# Patient Record
Sex: Female | Born: 1945 | Race: White | Hispanic: No | Smoking: Current every day smoker
Health system: Southern US, Community
[De-identification: ages and names within clinical notes are randomized; demographics above are authoritative.]

## PROBLEM LIST (undated history)

## (undated) DIAGNOSIS — I10 Essential (primary) hypertension: Secondary | ICD-10-CM

## (undated) DIAGNOSIS — E119 Type 2 diabetes mellitus without complications: Secondary | ICD-10-CM

## (undated) DIAGNOSIS — F419 Anxiety disorder, unspecified: Secondary | ICD-10-CM

## (undated) DIAGNOSIS — K219 Gastro-esophageal reflux disease without esophagitis: Secondary | ICD-10-CM

## (undated) DIAGNOSIS — J449 Chronic obstructive pulmonary disease, unspecified: Secondary | ICD-10-CM

## (undated) DIAGNOSIS — N289 Disorder of kidney and ureter, unspecified: Secondary | ICD-10-CM

## (undated) DIAGNOSIS — E7800 Pure hypercholesterolemia, unspecified: Secondary | ICD-10-CM

## (undated) DIAGNOSIS — M1990 Unspecified osteoarthritis, unspecified site: Secondary | ICD-10-CM

## (undated) HISTORY — PX: TOTAL HIP ARTHROPLASTY: SHX124

## (undated) HISTORY — PX: CHOLECYSTECTOMY: SHX55

---

## 2017-02-28 ENCOUNTER — Other Ambulatory Visit: Payer: Self-pay

## 2017-02-28 ENCOUNTER — Inpatient Hospital Stay (HOSPITAL_BASED_OUTPATIENT_CLINIC_OR_DEPARTMENT_OTHER)
Admission: EM | Admit: 2017-02-28 | Discharge: 2017-03-05 | DRG: 481 | Disposition: A | Discharge status: Skilled Nursing Facility | Payer: Medicare Other | Attending: Family Medicine | Admitting: Family Medicine

## 2017-02-28 ENCOUNTER — Inpatient Hospital Stay (HOSPITAL_BASED_OUTPATIENT_CLINIC_OR_DEPARTMENT_OTHER): Payer: Medicare Other

## 2017-02-28 ENCOUNTER — Emergency Department (HOSPITAL_BASED_OUTPATIENT_CLINIC_OR_DEPARTMENT_OTHER): Payer: Medicare Other

## 2017-02-28 ENCOUNTER — Encounter (HOSPITAL_BASED_OUTPATIENT_CLINIC_OR_DEPARTMENT_OTHER): Payer: Self-pay | Admitting: Emergency Medicine

## 2017-02-28 DIAGNOSIS — S72402A Unspecified fracture of lower end of left femur, initial encounter for closed fracture: Principal | ICD-10-CM | POA: Diagnosis present

## 2017-02-28 DIAGNOSIS — Y9234 Swimming pool (public) as the place of occurrence of the external cause: Secondary | ICD-10-CM

## 2017-02-28 DIAGNOSIS — R339 Retention of urine, unspecified: Secondary | ICD-10-CM | POA: Diagnosis present

## 2017-02-28 DIAGNOSIS — M1990 Unspecified osteoarthritis, unspecified site: Secondary | ICD-10-CM | POA: Diagnosis present

## 2017-02-28 DIAGNOSIS — Z419 Encounter for procedure for purposes other than remedying health state, unspecified: Secondary | ICD-10-CM

## 2017-02-28 DIAGNOSIS — I129 Hypertensive chronic kidney disease with stage 1 through stage 4 chronic kidney disease, or unspecified chronic kidney disease: Secondary | ICD-10-CM | POA: Diagnosis present

## 2017-02-28 DIAGNOSIS — J441 Chronic obstructive pulmonary disease with (acute) exacerbation: Secondary | ICD-10-CM | POA: Diagnosis present

## 2017-02-28 DIAGNOSIS — M25562 Pain in left knee: Secondary | ICD-10-CM | POA: Diagnosis present

## 2017-02-28 DIAGNOSIS — N183 Chronic kidney disease, stage 3 (moderate): Secondary | ICD-10-CM | POA: Diagnosis present

## 2017-02-28 DIAGNOSIS — S7292XD Unspecified fracture of left femur, subsequent encounter for closed fracture with routine healing: Secondary | ICD-10-CM | POA: Diagnosis not present

## 2017-02-28 DIAGNOSIS — N179 Acute kidney failure, unspecified: Secondary | ICD-10-CM | POA: Diagnosis present

## 2017-02-28 DIAGNOSIS — E1122 Type 2 diabetes mellitus with diabetic chronic kidney disease: Secondary | ICD-10-CM | POA: Diagnosis present

## 2017-02-28 DIAGNOSIS — I10 Essential (primary) hypertension: Secondary | ICD-10-CM | POA: Diagnosis not present

## 2017-02-28 DIAGNOSIS — Z79899 Other long term (current) drug therapy: Secondary | ICD-10-CM | POA: Diagnosis not present

## 2017-02-28 DIAGNOSIS — M9702XA Periprosthetic fracture around internal prosthetic left hip joint, initial encounter: Secondary | ICD-10-CM | POA: Diagnosis present

## 2017-02-28 DIAGNOSIS — Z9981 Dependence on supplemental oxygen: Secondary | ICD-10-CM | POA: Diagnosis not present

## 2017-02-28 DIAGNOSIS — E119 Type 2 diabetes mellitus without complications: Secondary | ICD-10-CM | POA: Diagnosis not present

## 2017-02-28 DIAGNOSIS — Z7984 Long term (current) use of oral hypoglycemic drugs: Secondary | ICD-10-CM

## 2017-02-28 DIAGNOSIS — W19XXXA Unspecified fall, initial encounter: Secondary | ICD-10-CM

## 2017-02-28 DIAGNOSIS — M25579 Pain in unspecified ankle and joints of unspecified foot: Secondary | ICD-10-CM

## 2017-02-28 DIAGNOSIS — T148XXA Other injury of unspecified body region, initial encounter: Secondary | ICD-10-CM

## 2017-02-28 DIAGNOSIS — S7292XA Unspecified fracture of left femur, initial encounter for closed fracture: Secondary | ICD-10-CM | POA: Diagnosis not present

## 2017-02-28 DIAGNOSIS — K219 Gastro-esophageal reflux disease without esophagitis: Secondary | ICD-10-CM | POA: Diagnosis present

## 2017-02-28 DIAGNOSIS — F419 Anxiety disorder, unspecified: Secondary | ICD-10-CM | POA: Diagnosis present

## 2017-02-28 DIAGNOSIS — S72423A Displaced fracture of lateral condyle of unspecified femur, initial encounter for closed fracture: Secondary | ICD-10-CM

## 2017-02-28 DIAGNOSIS — S72433A Displaced fracture of medial condyle of unspecified femur, initial encounter for closed fracture: Secondary | ICD-10-CM

## 2017-02-28 DIAGNOSIS — F17200 Nicotine dependence, unspecified, uncomplicated: Secondary | ICD-10-CM | POA: Diagnosis present

## 2017-02-28 DIAGNOSIS — Z720 Tobacco use: Secondary | ICD-10-CM | POA: Diagnosis present

## 2017-02-28 DIAGNOSIS — E7800 Pure hypercholesterolemia, unspecified: Secondary | ICD-10-CM | POA: Diagnosis present

## 2017-02-28 DIAGNOSIS — W010XXA Fall on same level from slipping, tripping and stumbling without subsequent striking against object, initial encounter: Secondary | ICD-10-CM | POA: Diagnosis present

## 2017-02-28 HISTORY — DX: Type 2 diabetes mellitus without complications: E11.9

## 2017-02-28 HISTORY — DX: Disorder of kidney and ureter, unspecified: N28.9

## 2017-02-28 HISTORY — DX: Chronic obstructive pulmonary disease, unspecified: J44.9

## 2017-02-28 HISTORY — DX: Gastro-esophageal reflux disease without esophagitis: K21.9

## 2017-02-28 HISTORY — DX: Essential (primary) hypertension: I10

## 2017-02-28 HISTORY — DX: Pure hypercholesterolemia, unspecified: E78.00

## 2017-02-28 HISTORY — DX: Anxiety disorder, unspecified: F41.9

## 2017-02-28 HISTORY — DX: Unspecified osteoarthritis, unspecified site: M19.90

## 2017-02-28 LAB — COMPREHENSIVE METABOLIC PANEL
ALBUMIN: 3.8 g/dL (ref 3.5–5.0)
ALT: 23 U/L (ref 14–54)
ANION GAP: 9 (ref 5–15)
AST: 24 U/L (ref 15–41)
Alkaline Phosphatase: 63 U/L (ref 38–126)
BUN: 42 mg/dL — ABNORMAL HIGH (ref 6–20)
CHLORIDE: 107 mmol/L (ref 101–111)
CO2: 22 mmol/L (ref 22–32)
CREATININE: 2.18 mg/dL — AB (ref 0.44–1.00)
Calcium: 7.9 mg/dL — ABNORMAL LOW (ref 8.9–10.3)
GFR calc non Af Amer: 22 mL/min — ABNORMAL LOW (ref >60)
GFR, EST AFRICAN AMERICAN: 25 mL/min — AB (ref >60)
GLUCOSE: 106 mg/dL — AB (ref 65–99)
Potassium: 3.9 mmol/L (ref 3.5–5.1)
SODIUM: 138 mmol/L (ref 135–145)
Total Bilirubin: 0.5 mg/dL (ref 0.3–1.2)
Total Protein: 6.8 g/dL (ref 6.5–8.1)

## 2017-02-28 LAB — CBC WITH DIFFERENTIAL/PLATELET
BASOS ABS: 0.1 10*3/uL (ref 0.0–0.1)
BASOS PCT: 1 %
EOS ABS: 0.2 10*3/uL (ref 0.0–0.7)
EOS PCT: 2 %
HCT: 31.3 % — ABNORMAL LOW (ref 36.0–46.0)
HEMOGLOBIN: 10.4 g/dL — AB (ref 12.0–15.0)
LYMPHS ABS: 2.5 10*3/uL (ref 0.7–4.0)
Lymphocytes Relative: 28 %
MCH: 30.1 pg (ref 26.0–34.0)
MCHC: 33.2 g/dL (ref 30.0–36.0)
MCV: 90.7 fL (ref 78.0–100.0)
Monocytes Absolute: 0.8 10*3/uL (ref 0.1–1.0)
Monocytes Relative: 9 %
NEUTROS PCT: 60 %
Neutro Abs: 5.3 10*3/uL (ref 1.7–7.7)
PLATELETS: 252 10*3/uL (ref 150–400)
RBC: 3.45 MIL/uL — ABNORMAL LOW (ref 3.87–5.11)
RDW: 17.5 % — ABNORMAL HIGH (ref 11.5–15.5)
WBC: 8.8 10*3/uL (ref 4.0–10.5)

## 2017-02-28 LAB — PROTIME-INR
INR: 1.01
PROTHROMBIN TIME: 13.3 s (ref 11.4–15.2)

## 2017-02-28 MED ORDER — MORPHINE SULFATE (PF) 4 MG/ML IV SOLN
4.0000 mg | INTRAVENOUS | Status: AC | PRN
  Administered 2017-02-28 – 2017-03-01 (×3): 4 mg via INTRAVENOUS
  Filled 2017-02-28 (×3): qty 1

## 2017-02-28 MED ORDER — ONDANSETRON HCL 4 MG/2ML IJ SOLN
4.0000 mg | Freq: Once | INTRAMUSCULAR | Status: AC
  Administered 2017-02-28: 4 mg via INTRAVENOUS
  Filled 2017-02-28: qty 2

## 2017-02-28 NOTE — ED Notes (Signed)
ED Provider at bedside. 

## 2017-02-28 NOTE — ED Provider Notes (Signed)
[facility] Provider Note   CSN: [id number] Arrival date & time: 02/28/17  1708   By signing my name below, I, Vista Mink, attest that this documentation has been prepared under the direction and in the presence of Lorretta Harp, MD. Electronically signed, Vista Mink, ED Scribe. 02/28/17. 7:33 PM.   History   Chief Complaint Chief Complaint  Patient presents with  . Fall    HPI HPI Comments: Anita Butler is a 71 y.o. female with Hx of arthritis, DM, who presents to the Emergency Department s/p a fall that occurred just prior to arrival. Pt was at the pool and was getting in the water when she slipped and felt a pop in her left leg. Pt lives in [place] and pt is here visiting her daughter. Pt also has noted abrasions to her right foot as well as swelling to her right knee.   HPI  Past Medical History:  Diagnosis Date  . Anxiety   . Arthritis   . COPD (chronic obstructive pulmonary disease) (HCC)   . Diabetes mellitus without complication (HCC)   . GERD (gastroesophageal reflux disease)   . Hypercholesterolemia   . Hypertension     Patient Active Problem List   Diagnosis Date Noted  . Fracture of left femur (HCC) 02/28/2017    Past Surgical History:  Procedure Laterality Date  . CHOLECYSTECTOMY    . TOTAL HIP ARTHROPLASTY      OB History    No data available       Home Medications    Prior to Admission medications   Medication Sig Start Date End Date Taking? Authorizing Provider  alendronate (FOSAMAX) 70 MG tablet Take 70 mg by mouth once a week. Take with a full glass of water on an empty stomach.   Yes [provider]  amLODipine (NORVASC) 5 MG tablet Take 5 mg by mouth daily.   Yes [provider]  benazepril (LOTENSIN) 40 MG tablet Take 40 mg by mouth daily.   Yes [provider]  Calcium Carbonate-Vit D-Min (CALCIUM 1200 PO) Take by mouth.   Yes [provider]  cilostazol (PLETAL) 50 MG tablet Take 50 mg by  mouth 2 (two) times daily.   Yes [provider]  escitalopram (LEXAPRO) 20 MG tablet Take 20 mg by mouth daily.   Yes [provider]  gabapentin (NEURONTIN) 100 MG capsule Take 100 mg by mouth 3 (three) times daily.   Yes [provider]  glipiZIDE (GLIPIZIDE XL) 2.5 MG 24 hr tablet Take 2.5 mg by mouth daily with breakfast.   Yes [provider]  HYDROCHLOROTHIAZIDE PO Take by mouth.   Yes [provider]  levocetirizine (XYZAL) 5 MG tablet Take 5 mg by mouth every evening.   Yes [provider]  losartan (COZAAR) 50 MG tablet Take 50 mg by mouth daily.   Yes [provider]  metoprolol succinate (TOPROL-XL) 100 MG 24 hr tablet Take 100 mg by mouth daily. Take with or immediately following a meal.   Yes [provider]  mirabegron ER (MYRBETRIQ) 50 MG TB24 tablet Take 50 mg by mouth daily.   Yes [provider]  montelukast (SINGULAIR) 10 MG tablet Take 10 mg by mouth at bedtime.   Yes [provider]  Multiple Vitamin (MULTIVITAMIN) tablet Take 1 tablet by mouth daily.   Yes [provider]  omeprazole (PRILOSEC) 20 MG capsule Take 20 mg by mouth daily.   Yes [provider]  pravastatin (PRAVACHOL) 20 MG tablet Take 20 mg by mouth daily.   Yes [provider]  ranitidine (ZANTAC) 300 MG tablet Take 300 mg by mouth at bedtime.   Yes [provider]  sodium chloride 1 g tablet Take 1 g by mouth 3 (three) times daily.   Yes [provider]  traZODone (DESYREL) 100 MG tablet Take 100 mg by mouth at bedtime.   Yes [provider]    Family History History reviewed. No pertinent family history.  Social History Social History  Substance Use Topics  . Smoking status: Current Every Day Smoker  . Smokeless tobacco: Never Used  . Alcohol use No     Allergies   Keflex [cephalexin]   Review of Systems Review of Systems  Constitutional: Negative  for appetite change, chills, diaphoresis, fatigue and fever.  HENT: Negative for mouth sores, sore throat and trouble swallowing.   Eyes: Negative for visual disturbance.  Respiratory: Negative for cough, chest tightness, shortness of breath and wheezing.   Cardiovascular: Negative for chest pain.  Gastrointestinal: Negative for abdominal distention, abdominal pain, diarrhea, nausea and vomiting.  Endocrine: Negative for polydipsia, polyphagia and polyuria.  Genitourinary: Negative for dysuria, frequency and hematuria.  Musculoskeletal: Negative for gait problem.  Skin: Negative for color change, pallor and rash.  Neurological: Negative for dizziness, syncope, light-headedness and headaches.  Hematological: Does not bruise/bleed easily.  Psychiatric/Behavioral: Negative for behavioral problems and confusion.     Physical Exam Updated Vital Signs BP (!) 150/72 (BP Location: Right Arm)   Pulse 61   Temp 99.1 F (37.3 C) (Oral)   Resp 18   Ht 5\' 7"  (1.702 m)   Wt 68 kg (150 lb)   SpO2 98%   BMI 23.49 kg/m   Physical Exam  Constitutional: She is oriented to person, place, and time. She appears well-developed and well-nourished. No distress.  HENT:  Head: Normocephalic.  Eyes: Conjunctivae are normal. Pupils are equal, round, and reactive to light. No scleral icterus.  Neck: Normal range of motion. Neck supple. No thyromegaly present.  Cardiovascular: Normal rate and regular rhythm.  Exam reveals no gallop and no friction rub.   No murmur heard. Pulmonary/Chest: Effort normal and breath sounds normal. No respiratory distress. She has no wheezes. She has no rales.  Abdominal: Soft. Bowel sounds are normal. She exhibits no distension. There is no tenderness. There is no rebound.  Musculoskeletal: Normal range of motion. She exhibits edema. She exhibits no deformity.  Left knee with soft tissue swelling, ecchymosis, no deformity  Neurological: She is alert and oriented to person,  place, and time.  Skin: Skin is warm and dry. No rash noted.  Psychiatric: She has a normal mood and affect. Her behavior is normal.     ED Treatments / Results  DIAGNOSTIC STUDIES: Oxygen Saturation is 100% on RA, normal by my interpretation.  COORDINATION OF CARE: 7:33 PM-Discussed treatment plan with pt at bedside and pt agreed to plan.   Labs (all labs ordered are listed, but only abnormal results are displayed) Labs Reviewed  URINALYSIS, ROUTINE W REFLEX MICROSCOPIC  PROTIME-INR  CBC WITH DIFFERENTIAL/PLATELET  COMPREHENSIVE METABOLIC PANEL    EKG  EKG Interpretation None       Radiology Dg Knee Complete 4 Views Left  Result Date: 02/28/2017 CLINICAL DATA:  Fall at school today, complaining of left knee pain EXAM: LEFT KNEE - COMPLETE 4+ VIEW COMPARISON:  None. FINDINGS: Moderate lipo hemarthrosis. Linear lucency/nondisplaced fracture extending vertically through the  distal shaft, metaphysis and epiphysis of the distal femur with fracture extension to the articular surface, just lateral to the intercondylar region. Mild impaction of the distal femoral metaphysis at the lateral condyles. No other discrete fracture is visualized. Mild degenerative changes of the medial compartment. Vascular calcification IMPRESSION: 1. Nondisplaced distal femoral fracture with extension to the femoral articular surface, just lateral to midline. Mild impaction of the lateral metaphyseal cortex at the femoral condyles. 2. No dislocation 3. Moderate large lipo hemarthrosis Electronically Signed   By: Jasmine Pang M.D.   On: 02/28/2017 18:57   Dg Hip Unilat W Or Wo Pelvis 2-3 Views Left  Result Date: 02/28/2017 CLINICAL DATA:  Acute left hip pain following fall today. Initial encounter. EXAM: DG HIP (WITH OR WITHOUT PELVIS) 2-3V LEFT COMPARISON:  None. FINDINGS: Bilateral total hip arthroplasties identified. No acute fracture or dislocation noted. No suspicious focal bony lesions are noted.  Degenerative changes in the lower lumbar spine noted. IMPRESSION: No acute abnormality. Bilateral total hip arthroplasties. Electronically Signed   By: Harmon Pier M.D.   On: 02/28/2017 18:55   Dg Femur Min 2 Views Left  Result Date: 02/28/2017 CLINICAL DATA:  Distal femur fracture. EXAM: LEFT FEMUR 2 VIEWS COMPARISON:  XR and the from earlier today. FINDINGS: The patient is status post left hip replacement. Hardware is in good position. The fracture through the lateral aspect of the distal femur is again identified with no significant displacement. A joint effusion is noted. The remainder of the femur is normal. IMPRESSION: Known fracture in the lateral aspect of the distal femur. No other acute findings. Electronically Signed   By: Gerome Sam III M.D   On: 02/28/2017 20:05    Procedures Procedures (including critical care time)  Medications Ordered in ED Medications  ondansetron (ZOFRAN) injection 4 mg (not administered)  morphine 4 MG/ML injection 4 mg (not administered)     Initial Impression / Assessment and Plan / ED Course  I have reviewed the triage vital signs and the nursing notes.  Pertinent labs & imaging results that were available during my care of the patient were reviewed by me and considered in my medical decision making (see chart for details).       Final Clinical Impressions(s) / ED Diagnoses   Final diagnoses:  Closed fracture of left femur, unspecified fracture morphology, unspecified portion of femur, initial encounter [facility])    New Prescriptions New Prescriptions   No medications on file  I personally performed the services described in this documentation, which was scribed in my presence. The recorded information has been reviewed and is accurate.  Discussed with Dr. Shon Baton. Patient has distal femur/periprosthetic hip fracture. We'll require surgical intervention. Dr. Shon Baton prefers admission at [facility] where his partners will be available for  consultation and planned repair. Also discussed with hospitalist. Patient will be transferred for admission   Rolland Porter, MD 02/28/17 2053

## 2017-02-28 NOTE — ED Notes (Signed)
Oxygen sat 89% on RA. Pt placed on oxygen via  @ 2 L.

## 2017-02-28 NOTE — ED Triage Notes (Signed)
Patient states that she was getting in the pool and she slipped, and felt her left knee pop. The patient also has abrasions to her right foot. Patient has swelling and deformity to her left knee

## 2017-02-28 NOTE — Progress Notes (Signed)
This is a no charge note  [facility] per Dr. Fayrene FearingJames  71 year old lady with past medical history of hypertension, hyperlipidemia, diabetes mellitus, GERD, depression, anxiety, who presents with fall and left knee injury, found to have distal femur/periprosthetic hip fracture by X-ray. Ortho, Dr. Shon BatonBrooks was consulted by EDP.  Dr. Shon BatonBrooks prefers [facility] where his partners will be available for consultation and planned repair.  Patient was found to have WBC 8.8, INR 1.01, temperature 99.1, O2 sat 98% on room air, blood pressure 150/72. Pending urinalysis and BMP. Pending CXR. Pt is accepted to med-surg bed a inpt. Please put pt on NPO after MN.  Please call manager or [facility] at [phone] when pt arrives to floor  Lorretta HarpXilin Celesta Funderburk, MD  [facility] Pager [phone]  If 7PM-7AM, please contact night-coverage [email or web address] Password [email or web address] 02/28/2017, 9:23 PM

## 2017-03-01 ENCOUNTER — Inpatient Hospital Stay (HOSPITAL_COMMUNITY): Payer: Medicare Other

## 2017-03-01 ENCOUNTER — Encounter (HOSPITAL_COMMUNITY): Payer: Self-pay | Admitting: Internal Medicine

## 2017-03-01 ENCOUNTER — Encounter (HOSPITAL_COMMUNITY)
Admission: EM | Disposition: A | Discharge status: Skilled Nursing Facility | Payer: Self-pay | Source: Home / Self Care | Attending: Family Medicine

## 2017-03-01 ENCOUNTER — Inpatient Hospital Stay (HOSPITAL_COMMUNITY): Payer: Medicare Other | Admitting: Anesthesiology

## 2017-03-01 ENCOUNTER — Other Ambulatory Visit: Payer: Self-pay

## 2017-03-01 DIAGNOSIS — S7292XA Unspecified fracture of left femur, initial encounter for closed fracture: Secondary | ICD-10-CM

## 2017-03-01 DIAGNOSIS — I10 Essential (primary) hypertension: Secondary | ICD-10-CM | POA: Diagnosis present

## 2017-03-01 DIAGNOSIS — E7800 Pure hypercholesterolemia, unspecified: Secondary | ICD-10-CM | POA: Diagnosis present

## 2017-03-01 DIAGNOSIS — Z720 Tobacco use: Secondary | ICD-10-CM | POA: Diagnosis present

## 2017-03-01 DIAGNOSIS — N183 Chronic kidney disease, stage 3 (moderate): Secondary | ICD-10-CM

## 2017-03-01 DIAGNOSIS — K219 Gastro-esophageal reflux disease without esophagitis: Secondary | ICD-10-CM

## 2017-03-01 DIAGNOSIS — S7292XD Unspecified fracture of left femur, subsequent encounter for closed fracture with routine healing: Secondary | ICD-10-CM

## 2017-03-01 DIAGNOSIS — N179 Acute kidney failure, unspecified: Secondary | ICD-10-CM | POA: Diagnosis present

## 2017-03-01 DIAGNOSIS — J441 Chronic obstructive pulmonary disease with (acute) exacerbation: Secondary | ICD-10-CM | POA: Diagnosis present

## 2017-03-01 DIAGNOSIS — E119 Type 2 diabetes mellitus without complications: Secondary | ICD-10-CM

## 2017-03-01 HISTORY — PX: ORIF FEMUR FRACTURE: SHX2119

## 2017-03-01 LAB — BLOOD GAS, ARTERIAL
Acid-base deficit: 4 mmol/L — ABNORMAL HIGH (ref 0.0–2.0)
Bicarbonate: 22.3 mmol/L (ref 20.0–28.0)
DRAWN BY: 24485
O2 CONTENT: 4 L/min
O2 SAT: 96.9 %
PATIENT TEMPERATURE: 98.6
PO2 ART: 113 mmHg — AB (ref 83.0–108.0)
pCO2 arterial: 53.7 mmHg — ABNORMAL HIGH (ref 32.0–48.0)
pH, Arterial: 7.241 — ABNORMAL LOW (ref 7.350–7.450)

## 2017-03-01 LAB — SURGICAL PCR SCREEN
MRSA, PCR: NEGATIVE
STAPHYLOCOCCUS AUREUS: NEGATIVE

## 2017-03-01 LAB — GLUCOSE, CAPILLARY
GLUCOSE-CAPILLARY: 119 mg/dL — AB (ref 65–99)
GLUCOSE-CAPILLARY: 165 mg/dL — AB (ref 65–99)
Glucose-Capillary: 157 mg/dL — ABNORMAL HIGH (ref 65–99)
Glucose-Capillary: 156 mg/dL — ABNORMAL HIGH (ref 65–99)
Glucose-Capillary: 144 mg/dL — ABNORMAL HIGH (ref 65–99)
Glucose-Capillary: 141 mg/dL — ABNORMAL HIGH (ref 65–99)

## 2017-03-01 LAB — TYPE AND SCREEN
ABO/RH(D): B NEG
Antibody Screen: NEGATIVE

## 2017-03-01 LAB — CBC
HCT: 31.7 % — ABNORMAL LOW (ref 36.0–46.0)
Hemoglobin: 10.1 g/dL — ABNORMAL LOW (ref 12.0–15.0)
MCH: 29.1 pg (ref 26.0–34.0)
MCHC: 31.9 g/dL (ref 30.0–36.0)
MCV: 91.4 fL (ref 78.0–100.0)
PLATELETS: 216 10*3/uL (ref 150–400)
RBC: 3.47 MIL/uL — AB (ref 3.87–5.11)
RDW: 17.7 % — ABNORMAL HIGH (ref 11.5–15.5)
WBC: 8.8 10*3/uL (ref 4.0–10.5)

## 2017-03-01 LAB — BASIC METABOLIC PANEL
Anion gap: 7 (ref 5–15)
BUN: 38 mg/dL — AB (ref 6–20)
CALCIUM: 7.8 mg/dL — AB (ref 8.9–10.3)
CO2: 23 mmol/L (ref 22–32)
CREATININE: 2.18 mg/dL — AB (ref 0.44–1.00)
Chloride: 106 mmol/L (ref 101–111)
GFR calc Af Amer: 25 mL/min — ABNORMAL LOW (ref >60)
GFR, EST NON AFRICAN AMERICAN: 22 mL/min — AB (ref >60)
Glucose, Bld: 188 mg/dL — ABNORMAL HIGH (ref 65–99)
POTASSIUM: 3.8 mmol/L (ref 3.5–5.1)
SODIUM: 136 mmol/L (ref 135–145)

## 2017-03-01 LAB — ABO/RH: ABO/RH(D): B NEG

## 2017-03-01 LAB — APTT: aPTT: 35 seconds (ref 24–36)

## 2017-03-01 SURGERY — OPEN REDUCTION INTERNAL FIXATION (ORIF) DISTAL FEMUR FRACTURE
Anesthesia: Spinal | Site: Leg Upper | Laterality: Left

## 2017-03-01 MED ORDER — OXYCODONE HCL 5 MG PO TABS: 5.0000 mg | ORAL_TABLET | Freq: Once | ORAL | Status: DC | PRN

## 2017-03-01 MED ORDER — ALBUMIN HUMAN 5 % IV SOLN
12.5000 g | Freq: Once | INTRAVENOUS | Status: AC
  Administered 2017-03-01: 12.5 g via INTRAVENOUS

## 2017-03-01 MED ORDER — ALBUMIN HUMAN 5 % IV SOLN
INTRAVENOUS | Status: AC
  Filled 2017-03-01: qty 250

## 2017-03-01 MED ORDER — FENTANYL CITRATE (PF) 250 MCG/5ML IJ SOLN
INTRAMUSCULAR | Status: DC | PRN
  Administered 2017-03-01: 50 ug via INTRAVENOUS

## 2017-03-01 MED ORDER — HYDRALAZINE HCL 20 MG/ML IJ SOLN: 5.0000 mg | INTRAMUSCULAR | Status: DC | PRN

## 2017-03-01 MED ORDER — PANTOPRAZOLE SODIUM 40 MG PO TBEC
40.0000 mg | DELAYED_RELEASE_TABLET | Freq: Every day | ORAL | Status: DC
  Administered 2017-03-02 – 2017-03-05 (×4): 40 mg via ORAL
  Filled 2017-03-01 (×4): qty 1

## 2017-03-01 MED ORDER — PRAVASTATIN SODIUM 10 MG PO TABS
20.0000 mg | ORAL_TABLET | Freq: Every day | ORAL | Status: DC
  Administered 2017-03-01 – 2017-03-05 (×5): 20 mg via ORAL
  Filled 2017-03-01 (×6): qty 2

## 2017-03-01 MED ORDER — INSULIN ASPART 100 UNIT/ML ~~LOC~~ SOLN
0.0000 [IU] | Freq: Three times a day (TID) | SUBCUTANEOUS | Status: DC
  Administered 2017-03-01: 1 [IU] via SUBCUTANEOUS
  Administered 2017-03-01 (×2): 2 [IU] via SUBCUTANEOUS
  Administered 2017-03-02 (×2): 1 [IU] via SUBCUTANEOUS
  Administered 2017-03-03: 2 [IU] via SUBCUTANEOUS
  Administered 2017-03-03: 7 [IU] via SUBCUTANEOUS
  Administered 2017-03-03: 1 [IU] via SUBCUTANEOUS
  Administered 2017-03-04: 5 [IU] via SUBCUTANEOUS
  Administered 2017-03-04: 1 [IU] via SUBCUTANEOUS
  Administered 2017-03-04: 2 [IU] via SUBCUTANEOUS
  Administered 2017-03-05: 5 [IU] via SUBCUTANEOUS
  Administered 2017-03-05: 3 [IU] via SUBCUTANEOUS

## 2017-03-01 MED ORDER — FENTANYL CITRATE (PF) 100 MCG/2ML IJ SOLN: 25.0000 ug | INTRAMUSCULAR | Status: DC | PRN

## 2017-03-01 MED ORDER — ACETAMINOPHEN 325 MG PO TABS: 650.0000 mg | ORAL_TABLET | Freq: Four times a day (QID) | ORAL | Status: DC | PRN

## 2017-03-01 MED ORDER — ESCITALOPRAM OXALATE 20 MG PO TABS
20.0000 mg | ORAL_TABLET | Freq: Every day | ORAL | Status: DC
  Administered 2017-03-02 – 2017-03-05 (×4): 20 mg via ORAL
  Filled 2017-03-01 (×4): qty 1

## 2017-03-01 MED ORDER — EPHEDRINE SULFATE-NACL 50-0.9 MG/10ML-% IV SOSY
PREFILLED_SYRINGE | INTRAVENOUS | Status: DC | PRN
  Administered 2017-03-01: 10 mg via INTRAVENOUS

## 2017-03-01 MED ORDER — TRAZODONE HCL 100 MG PO TABS
100.0000 mg | ORAL_TABLET | Freq: Every day | ORAL | Status: DC
  Administered 2017-03-01 – 2017-03-04 (×4): 100 mg via ORAL
  Filled 2017-03-01 (×4): qty 1

## 2017-03-01 MED ORDER — MONTELUKAST SODIUM 10 MG PO TABS
10.0000 mg | ORAL_TABLET | Freq: Every day | ORAL | Status: DC
  Administered 2017-03-01 – 2017-03-04 (×4): 10 mg via ORAL
  Filled 2017-03-01 (×4): qty 1

## 2017-03-01 MED ORDER — 0.9 % SODIUM CHLORIDE (POUR BTL) OPTIME
TOPICAL | Status: DC | PRN
  Administered 2017-03-01: 1000 mL

## 2017-03-01 MED ORDER — ONDANSETRON HCL 4 MG/2ML IJ SOLN: 4.0000 mg | Freq: Three times a day (TID) | INTRAMUSCULAR | Status: DC | PRN

## 2017-03-01 MED ORDER — PROPOFOL 500 MG/50ML IV EMUL
INTRAVENOUS | Status: DC | PRN
  Administered 2017-03-01: 100 ug/kg/min via INTRAVENOUS

## 2017-03-01 MED ORDER — FENTANYL CITRATE (PF) 250 MCG/5ML IJ SOLN
INTRAMUSCULAR | Status: AC
  Filled 2017-03-01: qty 5

## 2017-03-01 MED ORDER — DEXTROSE 5 % IV SOLN: 900.0000 mg | INTRAVENOUS | Status: DC

## 2017-03-01 MED ORDER — METOPROLOL SUCCINATE ER 100 MG PO TB24
100.0000 mg | ORAL_TABLET | Freq: Every day | ORAL | Status: DC
Start: 2017-03-01 — End: 2017-03-05
  Administered 2017-03-01 – 2017-03-05 (×5): 100 mg via ORAL
  Filled 2017-03-01 (×5): qty 1

## 2017-03-01 MED ORDER — OXYCODONE HCL 5 MG/5ML PO SOLN: 5.0000 mg | Freq: Once | ORAL | Status: DC | PRN

## 2017-03-01 MED ORDER — IPRATROPIUM-ALBUTEROL 0.5-2.5 (3) MG/3ML IN SOLN: 3.0000 mL | RESPIRATORY_TRACT | Status: DC | PRN

## 2017-03-01 MED ORDER — HYDROCODONE-ACETAMINOPHEN 5-325 MG PO TABS
1.0000 | ORAL_TABLET | Freq: Four times a day (QID) | ORAL | Status: DC | PRN
  Administered 2017-03-02 – 2017-03-03 (×4): 2 via ORAL
  Filled 2017-03-01 (×4): qty 2

## 2017-03-01 MED ORDER — CLINDAMYCIN PHOSPHATE 900 MG/50ML IV SOLN
INTRAVENOUS | Status: AC
  Filled 2017-03-01: qty 50

## 2017-03-01 MED ORDER — SENNOSIDES-DOCUSATE SODIUM 8.6-50 MG PO TABS
1.0000 | ORAL_TABLET | Freq: Every evening | ORAL | Status: DC | PRN
  Administered 2017-03-04: 1 via ORAL
  Filled 2017-03-01: qty 1

## 2017-03-01 MED ORDER — CHLORHEXIDINE GLUCONATE 4 % EX LIQD: 60.0000 mL | Freq: Once | CUTANEOUS | Status: DC

## 2017-03-01 MED ORDER — ADULT MULTIVITAMIN W/MINERALS CH
1.0000 | ORAL_TABLET | Freq: Every day | ORAL | Status: DC
  Administered 2017-03-02 – 2017-03-05 (×4): 1 via ORAL
  Filled 2017-03-01 (×4): qty 1

## 2017-03-01 MED ORDER — INSULIN ASPART 100 UNIT/ML ~~LOC~~ SOLN
0.0000 [IU] | Freq: Every day | SUBCUTANEOUS | Status: DC
  Administered 2017-03-02: 3 [IU] via SUBCUTANEOUS

## 2017-03-01 MED ORDER — CLINDAMYCIN PHOSPHATE 600 MG/50ML IV SOLN
600.0000 mg | Freq: Four times a day (QID) | INTRAVENOUS | Status: AC
  Administered 2017-03-02 (×3): 600 mg via INTRAVENOUS
  Filled 2017-03-01 (×3): qty 50

## 2017-03-01 MED ORDER — ACETAMINOPHEN 650 MG RE SUPP: 650.0000 mg | Freq: Four times a day (QID) | RECTAL | Status: DC | PRN

## 2017-03-01 MED ORDER — BUDESONIDE 0.25 MG/2ML IN SUSP
0.2500 mg | Freq: Two times a day (BID) | RESPIRATORY_TRACT | Status: DC
  Administered 2017-03-02 – 2017-03-05 (×7): 0.25 mg via RESPIRATORY_TRACT
  Filled 2017-03-01 (×7): qty 2

## 2017-03-01 MED ORDER — MIRABEGRON ER 50 MG PO TB24
50.0000 mg | ORAL_TABLET | Freq: Every day | ORAL | Status: DC
  Administered 2017-03-02 – 2017-03-03 (×2): 50 mg via ORAL
  Filled 2017-03-01 (×3): qty 1

## 2017-03-01 MED ORDER — METHOCARBAMOL 500 MG PO TABS
500.0000 mg | ORAL_TABLET | Freq: Three times a day (TID) | ORAL | Status: DC | PRN
Start: 2017-03-01 — End: 2017-03-05
  Administered 2017-03-01 – 2017-03-03 (×3): 500 mg via ORAL
  Filled 2017-03-01 (×3): qty 1

## 2017-03-01 MED ORDER — PHENYLEPHRINE 40 MCG/ML (10ML) SYRINGE FOR IV PUSH (FOR BLOOD PRESSURE SUPPORT)
PREFILLED_SYRINGE | INTRAVENOUS | Status: DC | PRN
  Administered 2017-03-01: 120 ug via INTRAVENOUS
  Administered 2017-03-01: 80 ug via INTRAVENOUS

## 2017-03-01 MED ORDER — ALBUTEROL SULFATE (2.5 MG/3ML) 0.083% IN NEBU: 2.5000 mg | INHALATION_SOLUTION | RESPIRATORY_TRACT | Status: DC | PRN

## 2017-03-01 MED ORDER — MORPHINE SULFATE (PF) 4 MG/ML IV SOLN
1.0000 mg | INTRAVENOUS | Status: DC | PRN
  Administered 2017-03-01 – 2017-03-02 (×3): 1 mg via INTRAVENOUS
  Filled 2017-03-01 (×3): qty 1

## 2017-03-01 MED ORDER — CILOSTAZOL 50 MG PO TABS
50.0000 mg | ORAL_TABLET | Freq: Two times a day (BID) | ORAL | Status: DC
  Administered 2017-03-01 – 2017-03-05 (×8): 50 mg via ORAL
  Filled 2017-03-01 (×11): qty 1

## 2017-03-01 MED ORDER — NICOTINE 21 MG/24HR TD PT24
21.0000 mg | MEDICATED_PATCH | Freq: Every day | TRANSDERMAL | Status: DC
  Administered 2017-03-02 – 2017-03-05 (×4): 21 mg via TRANSDERMAL
  Filled 2017-03-01 (×4): qty 1

## 2017-03-01 MED ORDER — MIDAZOLAM HCL 5 MG/5ML IJ SOLN
INTRAMUSCULAR | Status: DC | PRN
  Administered 2017-03-01: 2 mg via INTRAVENOUS

## 2017-03-01 MED ORDER — SODIUM CHLORIDE 0.9 % IV SOLN
INTRAVENOUS | Status: DC
  Administered 2017-03-01 – 2017-03-03 (×4): via INTRAVENOUS

## 2017-03-01 MED ORDER — PHENYLEPHRINE HCL 10 MG/ML IJ SOLN
INTRAVENOUS | Status: DC | PRN
  Administered 2017-03-01: 100 ug/min via INTRAVENOUS
  Administered 2017-03-01: 75 ug/min via INTRAVENOUS

## 2017-03-01 MED ORDER — CLINDAMYCIN PHOSPHATE 900 MG/50ML IV SOLN
INTRAVENOUS | Status: DC | PRN
  Administered 2017-03-01: 900 mg via INTRAVENOUS

## 2017-03-01 MED ORDER — FAMOTIDINE 20 MG PO TABS
20.0000 mg | ORAL_TABLET | Freq: Every day | ORAL | Status: DC
  Administered 2017-03-02 – 2017-03-04 (×3): 20 mg via ORAL
  Filled 2017-03-01 (×3): qty 1

## 2017-03-01 MED ORDER — POVIDONE-IODINE 10 % EX SWAB: 2.0000 "application " | Freq: Once | CUTANEOUS | Status: DC

## 2017-03-01 MED ORDER — SODIUM CHLORIDE 0.9 % IV SOLN
INTRAVENOUS | Status: DC
  Administered 2017-03-01: 19:00:00 via INTRAVENOUS

## 2017-03-01 MED ORDER — IPRATROPIUM-ALBUTEROL 0.5-2.5 (3) MG/3ML IN SOLN
3.0000 mL | RESPIRATORY_TRACT | Status: DC
  Administered 2017-03-01 (×4): 3 mL via RESPIRATORY_TRACT
  Filled 2017-03-01 (×4): qty 3

## 2017-03-01 MED ORDER — SODIUM CHLORIDE 0.9 % IV SOLN: INTRAVENOUS | Status: DC

## 2017-03-01 MED ORDER — ONDANSETRON HCL 4 MG/2ML IJ SOLN: 4.0000 mg | Freq: Once | INTRAMUSCULAR | Status: DC | PRN

## 2017-03-01 MED ORDER — AZITHROMYCIN 250 MG PO TABS: 500.0000 mg | ORAL_TABLET | Freq: Every day | ORAL | Status: AC

## 2017-03-01 MED ORDER — AZITHROMYCIN 250 MG PO TABS
250.0000 mg | ORAL_TABLET | Freq: Every day | ORAL | Status: AC
  Administered 2017-03-02 – 2017-03-05 (×4): 250 mg via ORAL
  Filled 2017-03-01 (×4): qty 1

## 2017-03-01 MED ORDER — GABAPENTIN 100 MG PO CAPS
100.0000 mg | ORAL_CAPSULE | Freq: Three times a day (TID) | ORAL | Status: DC
  Administered 2017-03-02 – 2017-03-05 (×11): 100 mg via ORAL
  Filled 2017-03-01 (×11): qty 1

## 2017-03-01 MED ORDER — ARFORMOTEROL TARTRATE 15 MCG/2ML IN NEBU
15.0000 ug | INHALATION_SOLUTION | Freq: Two times a day (BID) | RESPIRATORY_TRACT | Status: DC
  Administered 2017-03-02 – 2017-03-05 (×7): 15 ug via RESPIRATORY_TRACT
  Filled 2017-03-01 (×7): qty 2

## 2017-03-01 MED ORDER — DM-GUAIFENESIN ER 30-600 MG PO TB12
1.0000 | ORAL_TABLET | Freq: Two times a day (BID) | ORAL | Status: DC
  Administered 2017-03-01 – 2017-03-05 (×8): 1 via ORAL
  Filled 2017-03-01 (×11): qty 1

## 2017-03-01 MED ORDER — AMLODIPINE BESYLATE 5 MG PO TABS
5.0000 mg | ORAL_TABLET | Freq: Every day | ORAL | Status: DC
  Administered 2017-03-02 – 2017-03-05 (×4): 5 mg via ORAL
  Filled 2017-03-01 (×4): qty 1

## 2017-03-01 MED ORDER — LORATADINE 10 MG PO TABS
10.0000 mg | ORAL_TABLET | Freq: Every evening | ORAL | Status: DC
  Administered 2017-03-02 – 2017-03-05 (×4): 10 mg via ORAL
  Filled 2017-03-01 (×4): qty 1

## 2017-03-01 MED ORDER — SODIUM CHLORIDE 0.9 % IV SOLN
0.0000 ug/min | INTRAVENOUS | Status: DC
  Administered 2017-03-01: 20 ug/min via INTRAVENOUS

## 2017-03-01 MED ORDER — MORPHINE SULFATE (PF) 4 MG/ML IV SOLN
4.0000 mg | INTRAVENOUS | Status: DC | PRN
Start: 2017-03-01 — End: 2017-03-01

## 2017-03-01 MED ORDER — CALCIUM CARBONATE-VITAMIN D 500-200 MG-UNIT PO TABS
1.0000 | ORAL_TABLET | Freq: Every day | ORAL | Status: DC
  Administered 2017-03-02 – 2017-03-05 (×4): 1 via ORAL
  Filled 2017-03-01 (×4): qty 1

## 2017-03-01 MED ORDER — SODIUM CHLORIDE 1 G PO TABS
1.0000 g | ORAL_TABLET | Freq: Three times a day (TID) | ORAL | Status: DC
  Administered 2017-03-02 – 2017-03-05 (×10): 1 g via ORAL
  Filled 2017-03-01 (×16): qty 1

## 2017-03-01 MED ORDER — MIDAZOLAM HCL 2 MG/2ML IJ SOLN
INTRAMUSCULAR | Status: AC
Start: 2017-03-01 — End: 2017-03-01
  Filled 2017-03-01: qty 2

## 2017-03-01 MED ORDER — FAMOTIDINE 20 MG PO TABS
40.0000 mg | ORAL_TABLET | Freq: Every day | ORAL | Status: DC
  Administered 2017-03-01: 40 mg via ORAL
  Filled 2017-03-01: qty 2

## 2017-03-01 SURGICAL SUPPLY — 55 items
BANDAGE ACE 4X5 VEL STRL LF (GAUZE/BANDAGES/DRESSINGS) ×3 IMPLANT
BANDAGE ACE 6X5 VEL STRL LF (GAUZE/BANDAGES/DRESSINGS) ×3 IMPLANT
BANDAGE ELASTIC 6 VELCRO ST LF (GAUZE/BANDAGES/DRESSINGS) ×3 IMPLANT
BIT DRILL CANN 2.7 (BIT) ×1
BIT DRILL CANN 2.7MM (BIT) ×1
BIT DRILL SRG 2.7XCANN AO CPLG (BIT) ×1 IMPLANT
BIT DRL SRG 2.7XCANN AO CPLNG (BIT) ×1
BLADE CLIPPER SURG (BLADE) IMPLANT
BNDG GAUZE ELAST 4 BULKY (GAUZE/BANDAGES/DRESSINGS) ×3 IMPLANT
DRAPE C-ARM 42X72 X-RAY (DRAPES) ×3 IMPLANT
DRAPE C-ARMOR (DRAPES) ×3 IMPLANT
DRAPE IMP U-DRAPE 54X76 (DRAPES) ×3 IMPLANT
DRAPE ORTHO SPLIT 77X108 STRL (DRAPES) ×4
DRAPE SURG ORHT 6 SPLT 77X108 (DRAPES) ×2 IMPLANT
DRAPE U-SHAPE 47X51 STRL (DRAPES) ×3 IMPLANT
DRSG ADAPTIC 3X8 NADH LF (GAUZE/BANDAGES/DRESSINGS) ×3 IMPLANT
DRSG PAD ABDOMINAL 8X10 ST (GAUZE/BANDAGES/DRESSINGS) ×12 IMPLANT
ELECT REM PT RETURN 9FT ADLT (ELECTROSURGICAL) ×3
ELECTRODE REM PT RTRN 9FT ADLT (ELECTROSURGICAL) ×1 IMPLANT
GAUZE SPONGE 4X4 12PLY STRL (GAUZE/BANDAGES/DRESSINGS) ×3 IMPLANT
GAUZE SPONGE 4X4 12PLY STRL LF (GAUZE/BANDAGES/DRESSINGS) ×3 IMPLANT
GLOVE BIO SURGEON STRL SZ7.5 (GLOVE) ×6 IMPLANT
GLOVE BIOGEL PI IND STRL 8 (GLOVE) ×2 IMPLANT
GLOVE BIOGEL PI INDICATOR 8 (GLOVE) ×4
GOWN STRL REUS W/ TWL LRG LVL3 (GOWN DISPOSABLE) ×2 IMPLANT
GOWN STRL REUS W/ TWL XL LVL3 (GOWN DISPOSABLE) ×1 IMPLANT
GOWN STRL REUS W/TWL LRG LVL3 (GOWN DISPOSABLE) ×4
GOWN STRL REUS W/TWL XL LVL3 (GOWN DISPOSABLE) ×2
K-WIRE ORTHOPEDIC 1.4X150L (WIRE) ×9
KIT BASIN OR (CUSTOM PROCEDURE TRAY) ×3 IMPLANT
KIT ROOM TURNOVER OR (KITS) ×3 IMPLANT
KWIRE ORTHOPEDIC 1.4X150L (WIRE) ×3 IMPLANT
MANIFOLD NEPTUNE II (INSTRUMENTS) ×3 IMPLANT
NS IRRIG 1000ML POUR BTL (IV SOLUTION) ×3 IMPLANT
PACK TOTAL JOINT (CUSTOM PROCEDURE TRAY) ×3 IMPLANT
PACK UNIVERSAL I (CUSTOM PROCEDURE TRAY) ×3 IMPLANT
PAD ARMBOARD 7.5X6 YLW CONV (MISCELLANEOUS) ×6 IMPLANT
PAD CAST 4YDX4 CTTN HI CHSV (CAST SUPPLIES) ×1 IMPLANT
PADDING CAST ABS 6INX4YD NS (CAST SUPPLIES) ×2
PADDING CAST ABS COTTON 6X4 NS (CAST SUPPLIES) ×1 IMPLANT
PADDING CAST COTTON 4X4 STRL (CAST SUPPLIES) ×2
PADDING CAST COTTON 6X4 STRL (CAST SUPPLIES) ×3 IMPLANT
SCREW CANN PT 60X4 ST SLF (Screw) ×2 IMPLANT
SCREW CANNULATED 4.0X60MM (Screw) ×4 IMPLANT
STAPLER VISISTAT 35W (STAPLE) IMPLANT
SUT MNCRL AB 4-0 PS2 18 (SUTURE) ×3 IMPLANT
SUT MON AB 2-0 CT1 27 (SUTURE) ×3 IMPLANT
SUT VIC AB 0 CT1 27 (SUTURE) ×4
SUT VIC AB 0 CT1 27XBRD ANBCTR (SUTURE) ×2 IMPLANT
TOWEL OR 17X24 6PK STRL BLUE (TOWEL DISPOSABLE) ×3 IMPLANT
TOWEL OR 17X26 10 PK STRL BLUE (TOWEL DISPOSABLE) ×6 IMPLANT
TOWEL OR NON WOVEN STRL DISP B (DISPOSABLE) ×3 IMPLANT
TRAY FOLEY W/METER SILVER 16FR (SET/KITS/TRAYS/PACK) IMPLANT
WASHER FOR 4.0 SCREWS (Washer) ×6 IMPLANT
WATER STERILE IRR 1000ML POUR (IV SOLUTION) ×6 IMPLANT

## 2017-03-01 NOTE — Op Note (Signed)
02/28/2017 - 03/01/2017  7:29 PM  PATIENT:  Anita Butler    PRE-OPERATIVE DIAGNOSIS:  left distal femur fx  POST-OPERATIVE DIAGNOSIS:  Same  PROCEDURE:  OPEN REDUCTION INTERNAL FIXATION (ORIF) DISTAL FEMUR FRACTURE  SURGEON:  MURPHY, TIMOTHY D, MD  ASSISTANT: none  ANESTHESIA:   gen  PREOPERATIVE INDICATIONS:  Anita Butler is a  71 y.o. female with a diagnosis of left distal femur fx who failed conservative measures and elected for surgical management.    The risks benefits and alternatives were discussed with the patient preoperatively including but not limited to the risks of infection, bleeding, nerve injury, cardiopulmonary complications, the need for revision surgery, among others, and the patient was willing to proceed.  OPERATIVE IMPLANTS: canulated screws  OPERATIVE FINDINGS: stable reduction  BLOOD LOSS: min  COMPLICATIONS: none  TOURNIQUET TIME: none  OPERATIVE PROCEDURE:  Patient was identified in the preoperative holding area and site was marked by me She was transported to the operating theater and placed on the table in supine position taking care to pad all bony prominences. After a preincinduction time out anesthesia was induced. The left lower extremity was prepped and draped in normal sterile fashion and a pre-incision timeout was performed. She received clinda for preoperative antibiotics.   I made a lateral incision over her fracture site with varus and mobilization of fracture piece is able to line this up well I placed 2 K wires across the fracture followed by 2 cannulated screws.  I took multiple x-rays of his very happy with the alignment of the reduction as well as the placement of the fixation.  I then thoroughly irrigated her incision and closed her skin in layers. Sterile dressing was applied to the place and knee immobilizer woken and taken the PACU in stable condition  POST OPERATIVE PLAN: Touchdown weightbearing chemical DVT prophylaxis

## 2017-03-01 NOTE — Progress Notes (Addendum)
Patient maintained on NPO. Md made aware that [place] from [place] and wanted to speak to him.

## 2017-03-01 NOTE — Progress Notes (Signed)
BP low. Dr. Noreene LarssonJoslin notified and neo gtt restarted from OR. (still Hanging) at 20 mcg/min.

## 2017-03-01 NOTE — Consult Note (Signed)
ORTHOPAEDIC CONSULTATION  REQUESTING PHYSICIAN: Doreatha Lew, MD  Chief Complaint: Left knee pain  HPI: Anita Butler is a 71 y.o. female who complains of a mechanical fall with pain at her L knee. [facility]. Denies N/T  Past Medical History:  Diagnosis Date  . Anxiety   . Arthritis   . COPD (chronic obstructive pulmonary disease) ([place])   . Diabetes mellitus without complication ([place])   . GERD (gastroesophageal reflux disease)   . Hypercholesterolemia   . Hypertension   . 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 CHOLECYSTECTOMY    . TOTAL HIP ARTHROPLASTY     Social History   Social History  . Marital status: Widowed    Spouse name: N/A  . Number of children: N/A  . Years of education: N/A   Social History Main Topics  . Smoking status: Current Every Day Smoker  . Smokeless tobacco: Never Used  . Alcohol use No  . Drug use: No  . Sexual activity: Not Asked   Other Topics Concern  . None   Social History Narrative  . None   Family History  Problem Relation Age of Onset  . Diabetes Mellitus II Mother   . Heart failure Mother   . Heart disease Father   . Diabetes Mellitus II Sister    Allergies  Allergen Reactions  . Keflex [Cephalexin] Other (See Comments)    Mental health issue    Prior to Admission medications   Medication Sig Start Date End Date Taking? Authorizing Provider  alendronate (FOSAMAX) 70 MG tablet Take 70 mg by mouth once a week. Take with a full glass of water on an empty stomach.   Yes [provider]  amLODipine (NORVASC) 5 MG tablet Take 5 mg by mouth daily.   Yes [provider]  benazepril (LOTENSIN) 40 MG tablet Take 40 mg by mouth daily.   Yes [provider]  Calcium Carbonate-Vit D-Min (CALCIUM 1200 PO) Take 1 tablet by mouth daily.    Yes [provider]  cilostazol (PLETAL) 50 MG tablet Take 50 mg  by mouth 2 (two) times daily.   Yes [provider]  escitalopram (LEXAPRO) 20 MG tablet Take 20 mg by mouth daily.   Yes [provider]  gabapentin (NEURONTIN) 100 MG capsule Take 100 mg by mouth 3 (three) times daily.   Yes [provider]  glipiZIDE (GLIPIZIDE XL) 2.5 MG 24 hr tablet Take 2.5 mg by mouth daily with breakfast.   Yes [provider]  hydrochlorothiazide (HYDRODIURIL) 25 MG tablet Take 25 mg by mouth daily. 02/04/17  Yes [provider]  levocetirizine (XYZAL) 5 MG tablet Take 5 mg by mouth every evening.   Yes [provider]  losartan (COZAAR) 50 MG tablet Take 50 mg by mouth daily.   Yes [provider]  metoprolol succinate (TOPROL-XL) 100 MG 24 hr tablet Take 100 mg by mouth daily. Take with or immediately following a meal.   Yes [provider]  mirabegron ER (MYRBETRIQ) 50 MG TB24 tablet Take 50 mg by mouth daily.   Yes [provider]  montelukast (SINGULAIR) 10 MG tablet Take 10 mg by mouth at bedtime.   Yes [provider]  Multiple Vitamin (MULTIVITAMIN) tablet Take 1 tablet by mouth daily.   Yes [provider]  omeprazole (PRILOSEC) 20 MG capsule  Take 20 mg by mouth daily.   Yes [provider]  pravastatin (PRAVACHOL) 20 MG tablet Take 20 mg by mouth daily.   Yes [provider]  ranitidine (ZANTAC) 300 MG tablet Take 300 mg by mouth at bedtime.   Yes [provider]  sodium chloride 1 g tablet Take 1 g by mouth 3 (three) times daily.   Yes [provider]  traZODone (DESYREL) 100 MG tablet Take 100 mg by mouth at bedtime.   Yes [provider]   Ct Femur Left Wo Contrast  Result Date: 03/01/2017 CLINICAL DATA:  Slipped and fell getting out of the a pool. EXAM: CT OF THE LOWER LEFT EXTREMITY WITHOUT CONTRAST TECHNIQUE: Multidetector CT imaging of the lower left extremity was performed according to the standard protocol.  COMPARISON:  Radiographs 6288 FINDINGS: There is a sagittally oriented fracture through the notch aspect of the lateral femoral condyles with minimal step-off at the articular surface. There is continuation of a nondisplaced fracture line up the posterior diaphyseal cortex, approximately 8 cm above the knee joint line. Articular relationships are maintained at the knee. Proximal tibia is intact. Patella is intact. Large lipohemarthrosis. IMPRESSION: Minimally displaced sagittal fracture through the notch aspect of the lateral femoral condyle. Large lipohemarthrosis. Electronically Signed   By: Andreas Newport M.D.   On: 03/01/2017 03:48   Dg Chest Port 1 View  Result Date: 02/28/2017 CLINICAL DATA:  Left femoral fracture.  Preoperative imaging. EXAM: PORTABLE CHEST 1 VIEW COMPARISON:  None. FINDINGS: Generalized interstitial coarsening. Probable upper lobe emphysematous disease. No airspace consolidation. No pneumothorax. No effusion. Normal heart size. Unremarkable hilar and mediastinal contours. 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 No consolidation or effusion. Electronically Signed   By: Andreas Newport M.D.   On: 02/28/2017 21:25   Dg Knee Complete 4 Views Left  Result Date: 02/28/2017 CLINICAL DATA:  Fall at school today, complaining of left knee pain EXAM: LEFT KNEE - COMPLETE 4+ VIEW COMPARISON:  None. FINDINGS: Moderate lipo hemarthrosis. Linear lucency/nondisplaced fracture extending vertically through the distal shaft, metaphysis and epiphysis of the distal femur with fracture extension to the articular surface, just lateral to the intercondylar region. Mild impaction of the distal femoral metaphysis at the lateral condyles. No other discrete fracture is visualized. Mild degenerative changes of the medial compartment. Vascular calcification IMPRESSION: 1. Nondisplaced distal femoral fracture with extension to the femoral articular surface, just lateral  to midline. Mild impaction of the lateral metaphyseal cortex at the femoral condyles. 2. No dislocation 3. Moderate large lipo hemarthrosis Electronically Signed   By: Donavan Foil M.D.   On: 02/28/2017 18:57   Dg Hip Unilat W Or Wo Pelvis 2-3 Views Left  Result Date: 02/28/2017 CLINICAL DATA:  Acute left hip pain following fall today. Initial encounter. EXAM: DG HIP (WITH OR WITHOUT PELVIS) 2-3V LEFT COMPARISON:  None. FINDINGS: Bilateral total hip arthroplasties identified. No acute fracture or dislocation noted. No suspicious focal bony lesions are noted. Degenerative changes in the lower lumbar spine noted. IMPRESSION: No acute abnormality. Bilateral total hip arthroplasties. Electronically Signed   By: Margarette Canada M.D.   On: 02/28/2017 18:55   Dg Femur Min 2 Views Left  Result Date: 02/28/2017 CLINICAL DATA:  Distal femur fracture. EXAM: LEFT FEMUR 2 VIEWS COMPARISON:  XR and the from earlier today. FINDINGS: The patient is status post left hip replacement. Hardware is in good position. The fracture through the lateral aspect of the distal femur is again  identified with no significant displacement. A joint effusion is noted. The remainder of the femur is normal. IMPRESSION: Known fracture in the lateral aspect of the distal femur. No other acute findings. Electronically Signed   By: Dorise Bullion III M.D   On: 02/28/2017 20:05    Positive ROS: All other systems have been reviewed and were otherwise negative with the exception of those mentioned in the HPI and as above.  Labs cbc  Recent Labs  02/28/17 2050 03/01/17 0131  WBC 8.8 8.8  HGB 10.4* 10.1*  HCT 31.3* 31.7*  PLT 252 216    Labs inflam No results for input(s): CRP in the last 72 hours.  Invalid input(s): ESR  Labs coag  Recent Labs  02/28/17 2050  INR 1.01     Recent Labs  02/28/17 2050 03/01/17 0131  NA 138 136  K 3.9 3.8  CL 107 106  CO2 22 23  GLUCOSE 106* 188*  BUN 42* 38*  CREATININE 2.18* 2.18*    CALCIUM 7.9* 7.8*    Physical Exam: Vitals:   03/01/17 0027 03/01/17 0645  BP: (!) 138/43 (!) 114/58  Pulse: 71 78  Resp: 18 16  Temp: 98.6 F (37 C) 98.4 F (36.9 C)   General: Alert, no acute distress Cardiovascular: No pedal edema Respiratory: No cyanosis, no use of accessory musculature GI: No organomegaly, abdomen is soft and non-tender Skin: No lesions in the area of chief complaint other than those listed below in MSK exam.  Neurologic: Sensation intact distally save for the below mentioned MSK exam Psychiatric: Patient is competent for consent with normal mood and affect Lymphatic: No axillary or cervical lymphadenopathy  MUSCULOSKELETAL:  LLE: compartments soft, NVI distally to her baseline. TTP at the knee Other extremities are atraumatic with painless ROM and NVI.  Assessment: Left [place]/Intracondylar distal femur fracture  Plan: ORIF in today TDWB post op    Renette Butters, MD Cell [phone]   03/01/2017 4:04 PM

## 2017-03-01 NOTE — H&P (Addendum)
History and Physical    Anita Butler WUJ:[id number] DOB: Sep 23, 1945 DOA: 02/28/2017  Referring MD/NP/PA:   PCP: System, Pcp Not In   Patient coming from:  The patient is coming from home.  At baseline, pt is independent for most of ADL.   Chief Complaint: fall and left knee injury  HPI: Anita Butler is a 71 y.o. female with medical history significant of hypertension, hyperlipidemia, diabetes mellitus, COPD, on 2 L oxygen at home, GERD, depression, anxiety, tobacco abuse, CKD-3, who presents with fall and left knee injury.  Pt states that she was getting in the pool, but slipped, injured her left knee, felt her left knee pop. She developed severe pain in the left knee and distal part of left upper leg. The patient also has abrasions to her right foot. Patient has swelling and deformity to her left knee. She states that her pain is constant, severe, sharp, nonradiating. No leg numbness. She denies chest pain, fever or chills. She has cough with yellow colored sputum production. She has shortness of breath due to COPD, which is at her baseline. She does not have nausea, vomiting or abdominal pain. She had 2 loose bowel movements today. Denies symptoms of UTI or unilateral weakness  ED Course: pt was found to have WBC 8.8, INR 1.01, pending urinalysis, worsening renal function, temperature 99.1, no tachycardia, oxygen saturation 98% on 2 L nasal cannula oxygen. Chest x-ray for chronic emphysematous change. X-ray of the left hip and pelvis negative. Pt is admitted to the MedSurg bed as inpatient. Orthopedic surgeon, Dr. Shon Baton was consulted.  X-ray of left knee showed 1. Nondisplaced distal femoral fracture with extension to the femoral articular surface, just lateral to midline. Mild impaction of the lateral metaphyseal cortex at the femoral condyles. 2. No dislocation 3. Moderate large lipo hemarthrosis  Review of Systems:   General: no fevers, chills, no changes in body weight, has  fatigue HEENT: no blurry vision, hearing changes or sore throat Respiratory: has dyspnea, coughing, wheezing CV: no chest pain, no palpitations GI: no nausea, vomiting, abdominal pain, constipation. Has loose stool. GU: no dysuria, burning on urination, increased urinary frequency, hematuria  Ext: no leg edema Neuro: no unilateral weakness, numbness, or tingling, no vision change or hearing loss Skin: no rash, no skin tear. MSK:  Heme: No easy bruising.  Travel history: No recent long distant travel.  Allergy:  Allergies  Allergen Reactions  . Keflex [Cephalexin] Other (See Comments)    Mental health issue     Past Medical History:  Diagnosis Date  . Anxiety   . Arthritis   . COPD (chronic obstructive pulmonary disease) (HCC)   . Diabetes mellitus without complication (HCC)   . GERD (gastroesophageal reflux disease)   . Hypercholesterolemia   . Hypertension   . Renal disorder    per pt 30% function    Past Surgical History:  Procedure Laterality Date  . CHOLECYSTECTOMY    . TOTAL HIP ARTHROPLASTY      Social History:  reports that she has been smoking.  She has never used smokeless tobacco. She reports that she does not drink alcohol or use drugs.  Family History:  Family History  Problem Relation Age of Onset  . Diabetes Mellitus II Mother   . Heart failure Mother   . Heart disease Father   . Diabetes Mellitus II Sister      Prior to Admission medications   Medication Sig Start Date End Date Taking? Authorizing Provider  alendronate (FOSAMAX)  70 MG tablet Take 70 mg by mouth once a week. Take with a full glass of water on an empty stomach.   Yes [provider]  amLODipine (NORVASC) 5 MG tablet Take 5 mg by mouth daily.   Yes [provider]  benazepril (LOTENSIN) 40 MG tablet Take 40 mg by mouth daily.   Yes [provider]  Calcium Carbonate-Vit D-Min (CALCIUM 1200 PO) Take by mouth.   Yes [provider]  cilostazol  (PLETAL) 50 MG tablet Take 50 mg by mouth 2 (two) times daily.   Yes [provider]  escitalopram (LEXAPRO) 20 MG tablet Take 20 mg by mouth daily.   Yes [provider]  gabapentin (NEURONTIN) 100 MG capsule Take 100 mg by mouth 3 (three) times daily.   Yes [provider]  glipiZIDE (GLIPIZIDE XL) 2.5 MG 24 hr tablet Take 2.5 mg by mouth daily with breakfast.   Yes [provider]  HYDROCHLOROTHIAZIDE PO Take by mouth.   Yes [provider]  levocetirizine (XYZAL) 5 MG tablet Take 5 mg by mouth every evening.   Yes [provider]  losartan (COZAAR) 50 MG tablet Take 50 mg by mouth daily.   Yes [provider]  metoprolol succinate (TOPROL-XL) 100 MG 24 hr tablet Take 100 mg by mouth daily. Take with or immediately following a meal.   Yes [provider]  mirabegron ER (MYRBETRIQ) 50 MG TB24 tablet Take 50 mg by mouth daily.   Yes [provider]  montelukast (SINGULAIR) 10 MG tablet Take 10 mg by mouth at bedtime.   Yes [provider]  Multiple Vitamin (MULTIVITAMIN) tablet Take 1 tablet by mouth daily.   Yes [provider]  omeprazole (PRILOSEC) 20 MG capsule Take 20 mg by mouth daily.   Yes [provider]  pravastatin (PRAVACHOL) 20 MG tablet Take 20 mg by mouth daily.   Yes [provider]  ranitidine (ZANTAC) 300 MG tablet Take 300 mg by mouth at bedtime.   Yes [provider]  sodium chloride 1 g tablet Take 1 g by mouth 3 (three) times daily.   Yes [provider]  traZODone (DESYREL) 100 MG tablet Take 100 mg by mouth at bedtime.   Yes [provider]    Physical Exam: Vitals:   02/28/17 2200 02/28/17 2230 03/01/17 0027 03/01/17 0358  BP: (!) 138/52 (!) 157/56 (!) 138/43   Pulse: 66 66 71   Resp: 20 20 18    Temp:   98.6 F (37 C)   TempSrc:   Oral   SpO2: 98% 97% 96% 95%  Weight:      Height:       General: Not in acute  distress HEENT:       Eyes: PERRL, EOMI, no scleral icterus.       ENT: No discharge from the ears and nose, no pharynx injection, no tonsillar enlargement.        Neck: No JVD, no bruit, no mass felt. Heme: No neck lymph node enlargement. Cardiac: S1/S2, RRR, No murmurs, No gallops or rubs. Respiratory: has rhonchi and wheezing bilaterally. GI: Soft, nondistended, nontender, no rebound pain, no organomegaly, BS present. GU: No hematuria Ext: No pitting leg edema bilaterally. 2+DP/PT pulse bilaterally. Musculoskeletal: has tenderness in left knee and distal part of left upper leg. Skin: abrasions to her right foot Neuro: Alert, oriented X3, cranial nerves II-XII grossly intact, moves all extremities normally. Marland Kitchen Psych: Patient is not psychotic,  no suicidal or hemocidal ideation.  Labs on Admission: I have personally reviewed following labs and imaging studies  CBC:  Recent Labs Lab 02/28/17 2050 03/01/17 0131  WBC 8.8 8.8  NEUTROABS 5.3  --   HGB 10.4* 10.1*  HCT 31.3* 31.7*  MCV 90.7 91.4  PLT 252 216   Basic Metabolic Panel:  Recent Labs Lab 02/28/17 2050 03/01/17 0131  NA 138 136  K 3.9 3.8  CL 107 106  CO2 22 23  GLUCOSE 106* 188*  BUN 42* 38*  CREATININE 2.18* 2.18*  CALCIUM 7.9* 7.8*   GFR: Estimated Creatinine Clearance: 23.7 mL/min (A) (by C-G formula based on SCr of 2.18 mg/dL (H)). Liver Function Tests:  Recent Labs Lab 02/28/17 2050  AST 24  ALT 23  ALKPHOS 63  BILITOT 0.5  PROT 6.8  ALBUMIN 3.8   No results for input(s): LIPASE, AMYLASE in the last 168 hours. No results for input(s): AMMONIA in the last 168 hours. Coagulation Profile:  Recent Labs Lab 02/28/17 2050  INR 1.01   Cardiac Enzymes: No results for input(s): CKTOTAL, CKMB, CKMBINDEX, TROPONINI in the last 168 hours. BNP (last 3 results) No results for input(s): PROBNP in the last 8760 hours. HbA1C: No results for input(s): HGBA1C in the last 72 hours. CBG:  Recent  Labs Lab 03/01/17 0035  GLUCAP 165*   Lipid Profile: No results for input(s): CHOL, HDL, LDLCALC, TRIG, CHOLHDL, LDLDIRECT in the last 72 hours. Thyroid Function Tests: No results for input(s): TSH, T4TOTAL, FREET4, T3FREE, THYROIDAB in the last 72 hours. Anemia Panel: No results for input(s): VITAMINB12, FOLATE, FERRITIN, TIBC, IRON, RETICCTPCT in the last 72 hours. Urine analysis: No results found for: COLORURINE, APPEARANCEUR, LABSPEC, PHURINE, GLUCOSEU, HGBUR, BILIRUBINUR, KETONESUR, PROTEINUR, UROBILINOGEN, NITRITE, LEUKOCYTESUR Sepsis Labs: @LABRCNTIP (procalcitonin:4,lacticidven:4) )No results found for this or any previous visit (from the past 240 hour(s)).   Radiological Exams on Admission: Ct Femur Left Wo Contrast  Result Date: 03/01/2017 CLINICAL DATA:  Slipped and fell getting out of the a pool. EXAM: CT OF THE LOWER LEFT EXTREMITY WITHOUT CONTRAST TECHNIQUE: Multidetector CT imaging of the lower left extremity was performed according to the standard protocol. COMPARISON:  Radiographs 6288 FINDINGS: There is a sagittally oriented fracture through the notch aspect of the lateral femoral condyles with minimal step-off at the articular surface. There is continuation of a nondisplaced fracture line up the posterior diaphyseal cortex, approximately 8 cm above the knee joint line. Articular relationships are maintained at the knee. Proximal tibia is intact. Patella is intact. Large lipohemarthrosis. IMPRESSION: Minimally displaced sagittal fracture through the notch aspect of the lateral femoral condyle. Large lipohemarthrosis. Electronically Signed   By: Ellery Plunk M.D.   On: 03/01/2017 03:48   Dg Chest Port 1 View  Result Date: 02/28/2017 CLINICAL DATA:  Left femoral fracture.  Preoperative imaging. EXAM: PORTABLE CHEST 1 VIEW COMPARISON:  None. FINDINGS: Generalized interstitial coarsening. Probable upper lobe emphysematous disease. No airspace consolidation. No pneumothorax.  No effusion. Normal heart size. Unremarkable hilar and mediastinal contours. 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 No consolidation or effusion. Electronically Signed   By: Ellery Plunk M.D.   On: 02/28/2017 21:25   Dg Knee Complete 4 Views Left  Result Date: 02/28/2017 CLINICAL DATA:  Fall at school today, complaining of left knee pain EXAM: LEFT KNEE - COMPLETE 4+ VIEW COMPARISON:  None. FINDINGS: Moderate lipo hemarthrosis. Linear lucency/nondisplaced fracture extending vertically through the distal shaft, metaphysis and epiphysis of the distal femur with  fracture extension to the articular surface, just lateral to the intercondylar region. Mild impaction of the distal femoral metaphysis at the lateral condyles. No other discrete fracture is visualized. Mild degenerative changes of the medial compartment. Vascular calcification IMPRESSION: 1. Nondisplaced distal femoral fracture with extension to the femoral articular surface, just lateral to midline. Mild impaction of the lateral metaphyseal cortex at the femoral condyles. 2. No dislocation 3. Moderate large lipo hemarthrosis Electronically Signed   By: Jasmine PangKim  Fujinaga M.D.   On: 02/28/2017 18:57   Dg Hip Unilat W Or Wo Pelvis 2-3 Views Left  Result Date: 02/28/2017 CLINICAL DATA:  Acute left hip pain following fall today. Initial encounter. EXAM: DG HIP (WITH OR WITHOUT PELVIS) 2-3V LEFT COMPARISON:  None. FINDINGS: Bilateral total hip arthroplasties identified. No acute fracture or dislocation noted. No suspicious focal bony lesions are noted. Degenerative changes in the lower lumbar spine noted. IMPRESSION: No acute abnormality. Bilateral total hip arthroplasties. Electronically Signed   By: Harmon PierJeffrey  Hu M.D.   On: 02/28/2017 18:55   Dg Femur Min 2 Views Left  Result Date: 02/28/2017 CLINICAL DATA:  Distal femur fracture. EXAM: LEFT FEMUR 2 VIEWS COMPARISON:  XR and the from earlier today. FINDINGS: The  patient is status post left hip replacement. Hardware is in good position. The fracture through the lateral aspect of the distal femur is again identified with no significant displacement. A joint effusion is noted. The remainder of the femur is normal. IMPRESSION: Known fracture in the lateral aspect of the distal femur. No other acute findings. Electronically Signed   By: Gerome Samavid  Williams III M.D   On: 02/28/2017 20:05     EKG: Independently reviewed.  Sinus rhythm, QTC 459, PVC, early R-wave progression   Assessment/Plan Principal Problem:   Fracture of left femur (HCC) Active Problems:   Hypertension   Hypercholesterolemia   GERD (gastroesophageal reflux disease)   Diabetes mellitus without complication (HCC)   COPD with acute exacerbation (HCC)   Acute renal failure superimposed on stage 3 chronic kidney disease (HCC)   Tobacco abuse   Fracture of left femur (HCC): As evidenced by x-ray. Patient has severe pain now. No neurovascular compromise. Orthopedic surgeon was consulted. Dr. Debbe BalesBrook is planning to do surgery today.  - will admit to Med-surg bed - Pain control: morphine prn and Norco - When necessary Zofran for nausea - Robaxin for muscle spasm - type and cross - INR/PTT  COPD with acute exacerbation: Patient has productive cough, bilateral wheezing and rhonchi on auscultation. No infiltration on chest x-ray, consistent with COPD exacerbation. -Nebulizers: scheduled Duoneb and prn albuterol -Solu-Medrol 60 mg IV tid  -Oral azithromycin for 5 days. -Continue Singulair  -Mucinex for cough  -Follow up sputum culture  AoCKD-III: Baseline Cre is 1.35 on 04/20/16, pt's Cre is 2.18 on admission. Likely due to prerenal secondary to dehydration and continuation of ARB, diruetics - IVF: 75 cc/h of NS - Follow up renal function by BMP  HTN: - Hold losartam and HCTZ due to worsening renal function -IV hydralazine when necessary -Continue amlodipine and metoprolol,    HLD: -Pravastatin  GERD: -Protonix -Pepcid   Tobacco abuse: -Did counseling about importance of quitting smoking -Nicotine patch  DM-II: Last A1c not on record. Patient is taking glipizide at home. Blood sugar 106. -SSI  DVT ppx: SCD Code Status: Full code Family Communication: None at bed side.    Disposition Plan:  Anticipate discharge back to previous home environment Consults called:  Ortho, Dr. Shon BatonBrooks  Admission status:  medical floor/obs       Date of Service 03/01/2017    Lorretta Harp [facility] Pager [phone]  If 7PM-7AM, please contact night-coverage [email or web address] Password [facility] 03/01/2017, 5:39 AM

## 2017-03-01 NOTE — Consult Note (Signed)
Patient ID: Anita Butler MRN: [id number] DOB/AGE: 70-27-1947 70 y.o.  Admit date: 02/28/2017  Admission Diagnoses:  Principal Problem:   Fracture of left femur (HCC) Active Problems:   Hypertension   Hypercholesterolemia   GERD (gastroesophageal reflux disease)   Diabetes mellitus without complication (HCC)   COPD with acute exacerbation (HCC)   Acute renal failure superimposed on stage 3 chronic kidney disease (HCC)   Tobacco abuse   HPI: Pleasant 71 year old female pt with a past med hx of hyper tension, DM and COPD on oxygen and CKD.  The presents after a trama in getting into a pool.  She reports widespread pain but severe pain in her left knee. CT is positive for a fracture through the notch aspect of the lateral femoral condyle.   Past Medical History: Past Medical History:  Diagnosis Date  . Anxiety   . Arthritis   . COPD (chronic obstructive pulmonary disease) (HCC)   . Diabetes mellitus without complication (HCC)   . GERD (gastroesophageal reflux disease)   . Hypercholesterolemia   . Hypertension   . Renal disorder    per pt 30% function    Surgical History: Past Surgical History:  Procedure Laterality Date  . CHOLECYSTECTOMY    . TOTAL HIP ARTHROPLASTY      Family History: Family History  Problem Relation Age of Onset  . Diabetes Mellitus II Mother   . Heart failure Mother   . Heart disease Father   . Diabetes Mellitus II Sister     Social History: Social History   Social History  . Marital status: Widowed    Spouse name: N/A  . Number of children: N/A  . Years of education: N/A   Occupational History  . Not on file.   Social History Main Topics  . Smoking status: Current Every Day Smoker  . Smokeless tobacco: Never Used  . Alcohol use No  . Drug use: No  . Sexual activity: Not on file   Other Topics Concern  . Not on file   Social History Narrative  . No narrative on file    Allergies: Keflex [cephalexin]  Medications: I  have reviewed the patient's current medications.  Vital Signs: Patient Vitals for the past 24 hrs:  BP Temp Temp src Pulse Resp SpO2 Height Weight  03/01/17 0645 (!) 114/58 98.4 F (36.9 C) Oral 78 16 97 % - -  03/01/17 0358 - - - - - 95 % - -  03/01/17 0027 (!) 138/43 98.6 F (37 C) Oral 71 18 96 % - -  02/28/17 2230 (!) 157/56 - - 66 20 97 % - -  02/28/17 2200 (!) 138/52 - - 66 20 98 % - -  02/28/17 2105 127/63 - - 61 17 93 % - -  02/28/17 1949 (!) 150/72 - - 61 18 98 % - -  02/28/17 1731 (!) 162/60 99.1 F (37.3 C) Oral 63 18 100 % 5\' 7"  (1.702 m) 68 kg (150 lb)    Radiology: Ct Femur Left Wo Contrast  Result Date: 03/01/2017 CLINICAL DATA:  Slipped and fell getting out of the a pool. EXAM: CT OF THE LOWER LEFT EXTREMITY WITHOUT CONTRAST TECHNIQUE: Multidetector CT imaging of the lower left extremity was performed according to the standard protocol. COMPARISON:  Radiographs 6288 FINDINGS: There is a sagittally oriented fracture through the notch aspect of the lateral femoral condyles with minimal step-off at the articular surface. There is continuation of a nondisplaced fracture line  up the posterior diaphyseal cortex, approximately 8 cm above the knee joint line. Articular relationships are maintained at the knee. Proximal tibia is intact. Patella is intact. Large lipohemarthrosis. IMPRESSION: Minimally displaced sagittal fracture through the notch aspect of the lateral femoral condyle. Large lipohemarthrosis. Electronically Signed   By: Ellery Plunk M.D.   On: 03/01/2017 03:48   Dg Chest Port 1 View  Result Date: 02/28/2017 CLINICAL DATA:  Left femoral fracture.  Preoperative imaging. EXAM: PORTABLE CHEST 1 VIEW COMPARISON:  None. FINDINGS: Generalized interstitial coarsening. Probable upper lobe emphysematous disease. No airspace consolidation. No pneumothorax. No effusion. Normal heart size. Unremarkable hilar and mediastinal contours. IMPRESSION: Probable emphysematous disease  and chronic interstitial coarsening. No consolidation or effusion. Electronically Signed   By: Ellery Plunk M.D.   On: 02/28/2017 21:25   Dg Knee Complete 4 Views Left  Result Date: 02/28/2017 CLINICAL DATA:  Fall at school today, complaining of left knee pain EXAM: LEFT KNEE - COMPLETE 4+ VIEW COMPARISON:  None. FINDINGS: Moderate lipo hemarthrosis. Linear lucency/nondisplaced fracture extending vertically through the distal shaft, metaphysis and epiphysis of the distal femur with fracture extension to the articular surface, just lateral to the intercondylar region. Mild impaction of the distal femoral metaphysis at the lateral condyles. No other discrete fracture is visualized. Mild degenerative changes of the medial compartment. Vascular calcification IMPRESSION: 1. Nondisplaced distal femoral fracture with extension to the femoral articular surface, just lateral to midline. Mild impaction of the lateral metaphyseal cortex at the femoral condyles. 2. No dislocation 3. Moderate large lipo hemarthrosis Electronically Signed   By: Jasmine Pang M.D.   On: 02/28/2017 18:57   Dg Hip Unilat W Or Wo Pelvis 2-3 Views Left  Result Date: 02/28/2017 CLINICAL DATA:  Acute left hip pain following fall today. Initial encounter. EXAM: DG HIP (WITH OR WITHOUT PELVIS) 2-3V LEFT COMPARISON:  None. FINDINGS: Bilateral total hip arthroplasties identified. No acute fracture or dislocation noted. No suspicious focal bony lesions are noted. Degenerative changes in the lower lumbar spine noted. IMPRESSION: No acute abnormality. Bilateral total hip arthroplasties. Electronically Signed   By: Harmon Pier M.D.   On: 02/28/2017 18:55   Dg Femur Min 2 Views Left  Result Date: 02/28/2017 CLINICAL DATA:  Distal femur fracture. EXAM: LEFT FEMUR 2 VIEWS COMPARISON:  XR and the from earlier today. FINDINGS: The patient is status post left hip replacement. Hardware is in good position. The fracture through the lateral aspect of  the distal femur is again identified with no significant displacement. A joint effusion is noted. The remainder of the femur is normal. IMPRESSION: Known fracture in the lateral aspect of the distal femur. No other acute findings. Electronically Signed   By: Gerome Sam III M.D   On: 02/28/2017 20:05    Labs:  Recent Labs  02/28/17 2050 03/01/17 0131  WBC 8.8 8.8  RBC 3.45* 3.47*  HCT 31.3* 31.7*  PLT 252 216    Recent Labs  02/28/17 2050 03/01/17 0131  NA 138 136  K 3.9 3.8  CL 107 106  CO2 22 23  BUN 42* 38*  CREATININE 2.18* 2.18*  GLUCOSE 106* 188*  CALCIUM 7.9* 7.8*    Recent Labs  02/28/17 2050  INR 1.01    Review of Systems: ROS  Physical Exam: ABD soft Sensation intact distally Dorsiflexion/Plantar flexion intact No cellulitis present Compartment soft  Pt response to commands Pt is groggy this am Pts left leg is in a brace    Assessment  and Plan: Dr. Shon Baton has consulted Dr. Eulah Pont I also contacted Jamelle Rushing, Trama PA  [place], [place] for Venita Lick, MD [facility] [phone]

## 2017-03-01 NOTE — Anesthesia Postprocedure Evaluation (Signed)
Anesthesia Post Note  Patient: Anita Butler  Procedure(s) Performed: Procedure(s) (LRB): OPEN REDUCTION INTERNAL FIXATION (ORIF) DISTAL FEMUR FRACTURE (Left)     Patient location during evaluation: PACU Anesthesia Type: Spinal Level of consciousness: awake, awake and alert and oriented Pain management: pain level controlled Vital Signs Assessment: post-procedure vital signs reviewed and stable Respiratory status: spontaneous breathing, nonlabored ventilation and respiratory function stable Cardiovascular status: blood pressure returned to baseline Postop Assessment: no headache and spinal receding Anesthetic complications: no    Last Vitals:  Vitals:   03/01/17 2233 03/01/17 2235  BP: 127/71 (!) 148/67  Pulse: 78 77  Resp: 17 17  Temp:      Last Pain:  Vitals:   03/01/17 2230  TempSrc:   PainSc: 0-No pain                 [name]

## 2017-03-01 NOTE — Progress Notes (Signed)
BP improving . Neo gtt dropped to 1810mcg.min. Dr. Noreene LarssonJoslin ordered Albumin 5 % IV given

## 2017-03-01 NOTE — Anesthesia Preprocedure Evaluation (Signed)
Anesthesia Evaluation  Patient identified by MRN, date of birth, ID band Patient awake    Reviewed: Allergy & Precautions, NPO status , Patient's Chart, lab work & pertinent test results  Airway Mallampati: II  TM Distance: >3 FB Neck ROM: Full    Dental  (+) Edentulous Upper, Edentulous Lower   Pulmonary Current Smoker,    breath sounds clear to auscultation       Cardiovascular hypertension,  Rhythm:Regular Rate:Normal     Neuro/Psych    GI/Hepatic   Endo/Other  diabetes  Renal/GU      Musculoskeletal   Abdominal   Peds  Hematology   Anesthesia Other Findings   Reproductive/Obstetrics                             Anesthesia Physical Anesthesia Plan  ASA: III  Anesthesia Plan: Spinal   Post-op Pain Management:    Induction: Intravenous  PONV Risk Score and Plan:   Airway Management Planned: Natural Airway and Simple Face Mask  Additional Equipment:   Intra-op Plan:   Post-operative Plan:   Informed Consent: I have reviewed the patients History and Physical, chart, labs and discussed the procedure including the risks, benefits and alternatives for the proposed anesthesia with the patient or authorized representative who has indicated his/her understanding and acceptance.     Plan Discussed with: CRNA and Anesthesiologist  Anesthesia Plan Comments:         Anesthesia Quick Evaluation

## 2017-03-01 NOTE — Anesthesia Procedure Notes (Signed)
Spinal  Patient location during procedure: OR Start time: 03/01/2017 7:35 PM End time: 03/01/2017 7:40 PM Staffing Anesthesiologist: Noreene LarssonJOSLIN, Maisa Bedingfield Performed: anesthesiologist  Preanesthetic Checklist Completed: patient identified, site marked, surgical consent, pre-op evaluation, timeout performed, IV checked, risks and benefits discussed and monitors and equipment checked Spinal Block Patient position: left lateral decubitus Prep: ChloraPrep Patient monitoring: heart rate, cardiac monitor, continuous pulse ox and blood pressure Approach: left paramedian Location: L3-4 Injection technique: single-shot Needle Needle gauge: 22 G Needle length: 9 cm Additional Notes 14 mg 0.75% Bupivacaine injected easily

## 2017-03-01 NOTE — Progress Notes (Signed)
[facility]   Patient admitted after midnight see H&P for further details Chart reviewed, patient evaluated. Will continue to monitor breathing status - added Brovana and Pulmicort. Patient going for surgical repair tonight. Monitor Cr in AM. Will follow up closely   Latrelle DodrillEdwin Silva, MD

## 2017-03-01 NOTE — Transfer of Care (Signed)
Immediate Anesthesia Transfer of Care Note  Patient: Anita Butler  Procedure(s) Performed: Procedure(s): OPEN REDUCTION INTERNAL FIXATION (ORIF) DISTAL FEMUR FRACTURE (Left)  Patient Location: PACU  Anesthesia Type:Spinal  Level of Consciousness: sedated and patient cooperative  Airway & Oxygen Therapy: Patient Spontanous Breathing and Patient connected to nasal cannula oxygen  Post-op Assessment: Report given to RN, Post -op Vital signs reviewed and stable and Patient moving all extremities  Post vital signs: Reviewed and stable  Last Vitals:  Vitals:   03/01/17 0027 03/01/17 0645  BP: (!) 138/43 (!) 114/58  Pulse: 71 78  Resp: 18 16  Temp: 37 C 36.9 C    Last Pain:  Vitals:   03/01/17 1136  TempSrc:   PainSc: Asleep      Patients Stated Pain Goal: 2 (02/28/17 2354)  Complications: No apparent anesthesia complications

## 2017-03-02 ENCOUNTER — Inpatient Hospital Stay (HOSPITAL_COMMUNITY): Payer: Medicare Other

## 2017-03-02 ENCOUNTER — Encounter (HOSPITAL_COMMUNITY): Payer: Self-pay | Admitting: *Deleted

## 2017-03-02 DIAGNOSIS — E119 Type 2 diabetes mellitus without complications: Secondary | ICD-10-CM

## 2017-03-02 DIAGNOSIS — I10 Essential (primary) hypertension: Secondary | ICD-10-CM

## 2017-03-02 DIAGNOSIS — Z720 Tobacco use: Secondary | ICD-10-CM

## 2017-03-02 DIAGNOSIS — N179 Acute kidney failure, unspecified: Secondary | ICD-10-CM

## 2017-03-02 DIAGNOSIS — J441 Chronic obstructive pulmonary disease with (acute) exacerbation: Secondary | ICD-10-CM

## 2017-03-02 DIAGNOSIS — N183 Chronic kidney disease, stage 3 (moderate): Secondary | ICD-10-CM

## 2017-03-02 LAB — CBC
HEMATOCRIT: 28 % — AB (ref 36.0–46.0)
Hemoglobin: 8.8 g/dL — ABNORMAL LOW (ref 12.0–15.0)
MCH: 29.3 pg (ref 26.0–34.0)
MCHC: 31.4 g/dL (ref 30.0–36.0)
MCV: 93.3 fL (ref 78.0–100.0)
PLATELETS: 167 10*3/uL (ref 150–400)
RBC: 3 MIL/uL — ABNORMAL LOW (ref 3.87–5.11)
RDW: 18 % — AB (ref 11.5–15.5)
WBC: 6.7 10*3/uL (ref 4.0–10.5)

## 2017-03-02 LAB — BASIC METABOLIC PANEL
Anion gap: 4 — ABNORMAL LOW (ref 5–15)
BUN: 26 mg/dL — AB (ref 6–20)
CALCIUM: 7.1 mg/dL — AB (ref 8.9–10.3)
CO2: 24 mmol/L (ref 22–32)
CREATININE: 1.78 mg/dL — AB (ref 0.44–1.00)
Chloride: 109 mmol/L (ref 101–111)
GFR calc non Af Amer: 28 mL/min — ABNORMAL LOW (ref >60)
GFR, EST AFRICAN AMERICAN: 32 mL/min — AB (ref >60)
Glucose, Bld: 141 mg/dL — ABNORMAL HIGH (ref 65–99)
Potassium: 4 mmol/L (ref 3.5–5.1)
Sodium: 137 mmol/L (ref 135–145)

## 2017-03-02 LAB — GLUCOSE, CAPILLARY
GLUCOSE-CAPILLARY: 264 mg/dL — AB (ref 65–99)
Glucose-Capillary: 134 mg/dL — ABNORMAL HIGH (ref 65–99)

## 2017-03-02 MED ORDER — PREDNISONE 20 MG PO TABS
40.0000 mg | ORAL_TABLET | Freq: Every day | ORAL | Status: DC
  Administered 2017-03-02 – 2017-03-05 (×4): 40 mg via ORAL
  Filled 2017-03-02 (×4): qty 2

## 2017-03-02 MED ORDER — PREDNISONE 20 MG PO TABS: 40.0000 mg | ORAL_TABLET | Freq: Every day | ORAL | Status: DC

## 2017-03-02 MED ORDER — HEPARIN SODIUM (PORCINE) 5000 UNIT/ML IJ SOLN
5000.0000 [IU] | Freq: Three times a day (TID) | INTRAMUSCULAR | Status: DC
  Administered 2017-03-02 – 2017-03-05 (×10): 5000 [IU] via SUBCUTANEOUS
  Filled 2017-03-02 (×9): qty 1

## 2017-03-02 NOTE — Progress Notes (Signed)
Neo gtt discontinued. BP returned to preop levels.

## 2017-03-02 NOTE — Progress Notes (Signed)
Subjective: 1 Day Post-Op Procedure(s) (LRB): OPEN REDUCTION INTERNAL FIXATION (ORIF) DISTAL FEMUR FRACTURE (Left) Patient resting comfortably.  Objective: Vital signs in last 24 hours: Temp:  [98.3 F (36.8 C)-99 F (37.2 C)] 99 F (37.2 C) (06/30 0537) Pulse Rate:  [71-90] 84 (06/30 0537) Resp:  [16-26] 17 (06/30 0537) BP: (65-153)/(45-89) 146/54 (06/30 0537) SpO2:  [92 %-100 %] 98 % (06/30 0927) Weight:  [68 kg (150 lb)] 68 kg (150 lb) (06/29 1814)  Intake/Output from previous day: 06/29 0701 - 06/30 0700 In: 3370.8 [P.O.:360; I.V.:2910.8; IV Piggyback:100] Out: 1550 [Urine:1500; Blood:50] Intake/Output this shift: No intake/output data recorded.   Recent Labs  02/28/17 2050 03/01/17 0131 03/02/17 0440  HGB 10.4* 10.1* 8.8*    Recent Labs  03/01/17 0131 03/02/17 0440  WBC 8.8 6.7  RBC 3.47* 3.00*  HCT 31.7* 28.0*  PLT 216 167    Recent Labs  03/01/17 0131 03/02/17 0440  NA 136 137  K 3.8 4.0  CL 106 109  CO2 23 24  BUN 38* 26*  CREATININE 2.18* 1.78*  GLUCOSE 188* 141*  CALCIUM 7.8* 7.1*    Recent Labs  02/28/17 2050  INR 1.01    Intact pulses distally Incision: dressing C/D/I  Assessment/Plan: 1 Day Post-Op Procedure(s) (LRB): OPEN REDUCTION INTERNAL FIXATION (ORIF) DISTAL FEMUR FRACTURE (Left) Up with therapy  TDWB LLE Pain control as ordered I believe plan is for SNF out of state Will cont to follow while inpatient  Margart SicklesChadwell, Cheri Ayotte 03/02/2017, 9:36 AM

## 2017-03-02 NOTE — Progress Notes (Signed)
Pt's dtr, Lavona MoundHolly Tucker at [phone] is from [place] also and is requesting to speak with a SW about the DC plan.  SW called. [place] is Fredderick PhenixCourtney Thompson at [phone].

## 2017-03-02 NOTE — Progress Notes (Signed)
PROGRESS NOTE [facility]   Anita Butler   UXL:[id number] DOB: Aug 04, 1946  DOA: 02/28/2017 PCP: System, Pcp Not In   Brief Narrative: 71 y/o F with PMHx of HTN, Type II DM, COPD on home oxygen tobacco abuse, GERD, depression, anxiety and CKD stage 3. Presented to ED with left knee injury due to fall. Patient is admitted for a non displaced distal femoral fracture, also was found to have mild COPD exacerbation. Patient went for ORIF and has been treated with nebulizers for COPD.  Subjective: Patient seen and examined this morning she is awake and alert, report she have some pain on her left knee also pain in her right ankle. She report her breathing is much better and feel that his baseline.  Assessment & Plan: Fracture of the left femur Status post ORIF Pain management as needed PT per orthopedic team She will need SNF for recovery, although patient is from [place] will dicussed with SW for d/c planning.   COPD with acute exacerbation, chronic home oxygen 2 L nasal cannula Patient back to baseline on oxygen supplementation Continue azithromycin, nebulizer, will add bursts of prednisone given significant wheezing Continue oxygen supplementation to keep oxygen saturation above 89%  Continue to monitor  Hypertension BP stable  Continue current regimen  Monitor BP   Diabetes mellitus CBG's stable  Continue SSI  Monitor   Acute renal failure superimposed on stage III chronic kidney disease sCr improving with hydration  Baseline Cr ~ 1.5 a year ago, likely progressive kidney disease Monitor kidney function   GERD Continue protonix   Tobacco abuse Tobacco cessation dicussed   DVT prophylaxis: Heparin sq  Code Status: Full code Family Communication: Discussed with daughter via phone call Disposition Plan: SNF when medically stable  Consultants:   Orthopedic surgeon  Procedures:   None  Antimicrobials: Anti-infectives    Start     Dose/Rate Route Frequency  Ordered Stop   03/02/17 1000  azithromycin (ZITHROMAX) tablet 250 mg     250 mg Oral Daily 03/01/17 0152 03/06/17 0959   03/02/17 0600  clindamycin (CLEOCIN) 900 mg in dextrose 5 % 100 mL IVPB  Status:  Discontinued     900 mg 200 mL/hr over 30 Minutes Intravenous On call to O.R. 03/01/17 2314 03/01/17 2320   03/02/17 0100  clindamycin (CLEOCIN) IVPB 600 mg     600 mg 100 mL/hr over 30 Minutes Intravenous Every 6 hours 03/01/17 2315 03/02/17 1444   03/01/17 1913  clindamycin (CLEOCIN) 900 MG/50ML IVPB  Status:  Discontinued    Comments:  Decarlo, Valerie   : cabinet override      03/01/17 1913 03/01/17 2319   03/01/17 1000  azithromycin (ZITHROMAX) tablet 500 mg     500 mg Oral Daily 03/01/17 0152 03/02/17 0959           Objective: Vitals:   03/01/17 2353 03/02/17 0003 03/02/17 0103 03/02/17 0537  BP: (!) 144/75 134/64 (!) 153/55 (!) 146/54  Pulse: 84 82 84 84  Resp: 18 17 18 17   Temp: 98.7 F (37.1 C) 98.5 F (36.9 C) 98.5 F (36.9 C) 99 F (37.2 C)  TempSrc: Oral Oral Oral Oral  SpO2: 100% 100% 100% 98%  Weight:      Height:        Intake/Output Summary (Last 24 hours) at 03/02/17 0743 Last data filed at 03/02/17 0700  Gross per 24 hour  Intake          3370.75 ml  Output  1550 ml  Net          1820.75 ml   Filed Weights   02/28/17 1731 03/01/17 1814  Weight: 68 kg (150 lb) 68 kg (150 lb)    Examination:  General exam: Appears calm and comfortable  Respiratory system: Decrease air entry, diffuse rhonchi with late expiratory wheezing Cardiovascular system: S1 & S2 heard, RRR. No JVD, murmurs, rubs or gallops Gastrointestinal system: Abdomen is nondistended, soft and nontender.  Central nervous system: Alert and oriented. No focal neurological deficits. Extremities: Splint on the LLE, range of movement diminished due to pain, pulses strong throughout   Skin: No rashes, lesions or ulcers Psychiatry: Mood & affect appropriate.    Data Reviewed: I  have personally reviewed following labs and imaging studies  CBC:  Recent Labs Lab 02/28/17 2050 03/01/17 0131 03/02/17 0440  WBC 8.8 8.8 6.7  NEUTROABS 5.3  --   --   HGB 10.4* 10.1* 8.8*  HCT 31.3* 31.7* 28.0*  MCV 90.7 91.4 93.3  PLT 252 216 167   Basic Metabolic Panel:  Recent Labs Lab 02/28/17 2050 03/01/17 0131 03/02/17 0440  NA 138 136 137  K 3.9 3.8 4.0  CL 107 106 109  CO2 22 23 24   GLUCOSE 106* 188* 141*  BUN 42* 38* 26*  CREATININE 2.18* 2.18* 1.78*  CALCIUM 7.9* 7.8* 7.1*   GFR: Estimated Creatinine Clearance: 29 mL/min (A) (by C-G formula based on SCr of 1.78 mg/dL (H)). Liver Function Tests:  Recent Labs Lab 02/28/17 2050  AST 24  ALT 23  ALKPHOS 63  BILITOT 0.5  PROT 6.8  ALBUMIN 3.8   Coagulation Profile:  Recent Labs Lab 02/28/17 2050  INR 1.01   CBG:  Recent Labs Lab 03/01/17 0751 03/01/17 1238 03/01/17 1546 03/01/17 1656 03/01/17 2309  GLUCAP 157* 156* 144* 141* 119*    Recent Results (from the past 240 hour(s))  Surgical pcr screen     Status: None   Collection Time: 03/01/17  2:28 PM  Result Value Ref Range Status   MRSA, PCR NEGATIVE NEGATIVE Final   Staphylococcus aureus NEGATIVE NEGATIVE Final    Comment:        The Xpert SA Assay (FDA approved for NASAL specimens in patients over 22 years of age), is one component of a comprehensive surveillance program.  Test performance has been validated by [facility] for patients greater than or equal to 33 year old. It is not intended to diagnose infection nor to guide or monitor treatment.       Radiology Studies: Ct Femur Left Wo Contrast  Result Date: 03/01/2017 CLINICAL DATA:  Slipped and fell getting out of the a pool. EXAM: CT OF THE LOWER LEFT EXTREMITY WITHOUT CONTRAST TECHNIQUE: Multidetector CT imaging of the lower left extremity was performed according to the standard protocol. COMPARISON:  Radiographs 6288 FINDINGS: There is a sagittally oriented  fracture through the notch aspect of the lateral femoral condyles with minimal step-off at the articular surface. There is continuation of a nondisplaced fracture line up the posterior diaphyseal cortex, approximately 8 cm above the knee joint line. Articular relationships are maintained at the knee. Proximal tibia is intact. Patella is intact. Large lipohemarthrosis. IMPRESSION: Minimally displaced sagittal fracture through the notch aspect of the lateral femoral condyle. Large lipohemarthrosis. Electronically Signed   By: Ellery Plunk M.D.   On: 03/01/2017 03:48   Dg Chest Port 1 View  Result Date: 02/28/2017 CLINICAL DATA:  Left femoral fracture.  Preoperative  imaging. EXAM: PORTABLE CHEST 1 VIEW COMPARISON:  None. FINDINGS: Generalized interstitial coarsening. Probable upper lobe emphysematous disease. No airspace consolidation. No pneumothorax. No effusion. Normal heart size. Unremarkable hilar and mediastinal contours. 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 No consolidation or effusion. Electronically Signed   By: Ellery Plunkaniel R Mitchell M.D.   On: 02/28/2017 21:25   Dg Knee Complete 4 Views Left  Result Date: 02/28/2017 CLINICAL DATA:  Fall at school today, complaining of left knee pain EXAM: LEFT KNEE - COMPLETE 4+ VIEW COMPARISON:  None. FINDINGS: Moderate lipo hemarthrosis. Linear lucency/nondisplaced fracture extending vertically through the distal shaft, metaphysis and epiphysis of the distal femur with fracture extension to the articular surface, just lateral to the intercondylar region. Mild impaction of the distal femoral metaphysis at the lateral condyles. No other discrete fracture is visualized. Mild degenerative changes of the medial compartment. Vascular calcification IMPRESSION: 1. Nondisplaced distal femoral fracture with extension to the femoral articular surface, just lateral to midline. Mild impaction of the lateral metaphyseal cortex at the  femoral condyles. 2. No dislocation 3. Moderate large lipo hemarthrosis Electronically Signed   By: Jasmine PangKim  Fujinaga M.D.   On: 02/28/2017 18:57   Dg Knee Left Port  Result Date: 03/01/2017 CLINICAL DATA:  ORIF left femur fracture. EXAM: PORTABLE LEFT KNEE - 1-2 VIEW COMPARISON:  05/29/2017 CT FINDINGS: Two screws traverse the distal femur. The known distal femur fracture is difficult to visualize on this study. No complicating features noted. IMPRESSION: Internal fixation of distal femur. No complicating features identified. Electronically Signed   By: Harmon PierJeffrey  Hu M.D.   On: 03/01/2017 22:02   Dg C-arm 1-60 Min  Result Date: 03/01/2017 CLINICAL DATA:  ORIF distal femur EXAM: LEFT FEMUR 2 VIEWS; DG C-ARM 61-120 MIN COMPARISON:  02/28/2017, 03/01/2017 FINDINGS: Two low resolution spot intraoperative views of the left femur. Total fluoroscopy time was 6 seconds. The images demonstrate 2 screw fixation of distal femoral fracture. IMPRESSION: Intraoperative fluoroscopic assistance provided during surgical fixation of distal femoral fracture Electronically Signed   By: Jasmine PangKim  Fujinaga M.D.   On: 03/01/2017 20:41   Dg Hip Unilat W Or Wo Pelvis 2-3 Views Left  Result Date: 02/28/2017 CLINICAL DATA:  Acute left hip pain following fall today. Initial encounter. EXAM: DG HIP (WITH OR WITHOUT PELVIS) 2-3V LEFT COMPARISON:  None. FINDINGS: Bilateral total hip arthroplasties identified. No acute fracture or dislocation noted. No suspicious focal bony lesions are noted. Degenerative changes in the lower lumbar spine noted. IMPRESSION: No acute abnormality. Bilateral total hip arthroplasties. Electronically Signed   By: Harmon PierJeffrey  Hu M.D.   On: 02/28/2017 18:55   Dg Femur Min 2 Views Left  Result Date: 03/01/2017 CLINICAL DATA:  ORIF distal femur EXAM: LEFT FEMUR 2 VIEWS; DG C-ARM 61-120 MIN COMPARISON:  02/28/2017, 03/01/2017 FINDINGS: Two low resolution spot intraoperative views of the left femur. Total fluoroscopy time  was 6 seconds. The images demonstrate 2 screw fixation of distal femoral fracture. IMPRESSION: Intraoperative fluoroscopic assistance provided during surgical fixation of distal femoral fracture Electronically Signed   By: Jasmine PangKim  Fujinaga M.D.   On: 03/01/2017 20:41   Dg Femur Min 2 Views Left  Result Date: 02/28/2017 CLINICAL DATA:  Distal femur fracture. EXAM: LEFT FEMUR 2 VIEWS COMPARISON:  XR and the from earlier today. FINDINGS: The patient is status post left hip replacement. Hardware is in good position. The fracture through the lateral aspect of the distal femur is again identified with no significant displacement. A joint effusion is  noted. The remainder of the femur is normal. IMPRESSION: Known fracture in the lateral aspect of the distal femur. No other acute findings. Electronically Signed   By: Gerome Sam III M.D   On: 02/28/2017 20:05     Scheduled Meds: . amLODipine  5 mg Oral Daily  . arformoterol  15 mcg Nebulization BID  . azithromycin  500 mg Oral Daily   Followed by  . azithromycin  250 mg Oral Daily  . budesonide (PULMICORT) nebulizer solution  0.25 mg Nebulization BID  . calcium-vitamin D  1 tablet Oral Daily  . cilostazol  50 mg Oral BID  . dextromethorphan-guaiFENesin  1 tablet Oral BID  . escitalopram  20 mg Oral Daily  . famotidine  20 mg Oral QHS  . gabapentin  100 mg Oral TID  . insulin aspart  0-5 Units Subcutaneous QHS  . insulin aspart  0-9 Units Subcutaneous TID WC  . loratadine  10 mg Oral QPM  . metoprolol succinate  100 mg Oral Daily  . mirabegron ER  50 mg Oral Daily  . montelukast  10 mg Oral QHS  . multivitamin with minerals  1 tablet Oral Daily  . nicotine  21 mg Transdermal Daily  . pantoprazole  40 mg Oral Daily  . povidone-iodine  2 application Topical Once  . pravastatin  20 mg Oral Daily  . sodium chloride  1 g Oral TID  . traZODone  100 mg Oral QHS   Continuous Infusions: . sodium chloride 75 mL/hr at 03/01/17 1518  . sodium  chloride 10 mL/hr at 03/01/17 1831  . sodium chloride    . albumin human    . clindamycin (CLEOCIN) IV 600 mg (03/02/17 0621)     LOS: 2 days    Time spent: Total of 25 minutes spent with pt, greater than 50% of which was spent in discussion of  treatment, counseling and coordination of care    Latrelle Dodrill, MD Pager: Text Page via [email or web address]  [phone]  If 7PM-7AM, please contact night-coverage [email or web address] Password [facility] 03/02/2017, 7:43 AM

## 2017-03-02 NOTE — Progress Notes (Addendum)
Patient incontinent urine. Bladder scan done. >999. In and out cath done. Using 14 Fr. Catheter. 1400 ml clear urine yellow obtained. Dr. Noreene LarssonJoslin notifiied of amount . Does not think indwelling cath necessary at this time, patients spinal anesthesia wearing off and should be able to void on her own later in am.

## 2017-03-02 NOTE — Progress Notes (Signed)
Called respiratory to give treatments.  Pt has crackles  Upper lungs.

## 2017-03-03 LAB — COMPREHENSIVE METABOLIC PANEL
ALT: 123 U/L — ABNORMAL HIGH (ref 14–54)
AST: 75 U/L — AB (ref 15–41)
Albumin: 2.9 g/dL — ABNORMAL LOW (ref 3.5–5.0)
Alkaline Phosphatase: 112 U/L (ref 38–126)
Anion gap: 6 (ref 5–15)
BUN: 24 mg/dL — AB (ref 6–20)
CHLORIDE: 104 mmol/L (ref 101–111)
CO2: 24 mmol/L (ref 22–32)
Calcium: 7.2 mg/dL — ABNORMAL LOW (ref 8.9–10.3)
Creatinine, Ser: 1.68 mg/dL — ABNORMAL HIGH (ref 0.44–1.00)
GFR, EST AFRICAN AMERICAN: 35 mL/min — AB (ref >60)
GFR, EST NON AFRICAN AMERICAN: 30 mL/min — AB (ref >60)
Glucose, Bld: 195 mg/dL — ABNORMAL HIGH (ref 65–99)
POTASSIUM: 4.1 mmol/L (ref 3.5–5.1)
SODIUM: 134 mmol/L — AB (ref 135–145)
Total Bilirubin: 0.6 mg/dL (ref 0.3–1.2)
Total Protein: 5.7 g/dL — ABNORMAL LOW (ref 6.5–8.1)

## 2017-03-03 LAB — GLUCOSE, CAPILLARY
GLUCOSE-CAPILLARY: 316 mg/dL — AB (ref 65–99)
GLUCOSE-CAPILLARY: 167 mg/dL — AB (ref 65–99)
Glucose-Capillary: 168 mg/dL — ABNORMAL HIGH (ref 65–99)
Glucose-Capillary: 136 mg/dL — ABNORMAL HIGH (ref 65–99)
Glucose-Capillary: 305 mg/dL — ABNORMAL HIGH (ref 65–99)

## 2017-03-03 LAB — CBC WITH DIFFERENTIAL/PLATELET
Basophils Absolute: 0 10*3/uL (ref 0.0–0.1)
Basophils Relative: 0 %
Eosinophils Absolute: 0 10*3/uL (ref 0.0–0.7)
Eosinophils Relative: 0 %
HCT: 30.5 % — ABNORMAL LOW (ref 36.0–46.0)
HEMOGLOBIN: 9.4 g/dL — AB (ref 12.0–15.0)
LYMPHS ABS: 1 10*3/uL (ref 0.7–4.0)
LYMPHS PCT: 18 %
MCH: 28.6 pg (ref 26.0–34.0)
MCHC: 30.8 g/dL (ref 30.0–36.0)
MCV: 92.7 fL (ref 78.0–100.0)
Monocytes Absolute: 0.5 10*3/uL (ref 0.1–1.0)
Monocytes Relative: 10 %
NEUTROS PCT: 72 %
Neutro Abs: 3.8 10*3/uL (ref 1.7–7.7)
Platelets: 172 10*3/uL (ref 150–400)
RBC: 3.29 MIL/uL — AB (ref 3.87–5.11)
RDW: 17.6 % — ABNORMAL HIGH (ref 11.5–15.5)
WBC: 5.3 10*3/uL (ref 4.0–10.5)

## 2017-03-03 NOTE — Progress Notes (Addendum)
PROGRESS NOTE [facility]   Anita Butler   NWG:[id number] DOB: [birth year]  DOA: 02/28/2017 PCP: System, Pcp Not In   Brief Narrative: 71 y/o F with PMHx of HTN, Type II DM, COPD on home oxygen tobacco abuse, GERD, depression, anxiety and CKD stage 3. Presented to ED with left knee injury due to fall. Patient is admitted for a non displaced distal femoral fracture, also was found to have mild COPD exacerbation. Patient went for ORIF and has been treated with nebulizers for COPD.  Subjective: Patient seen and examined, her breathing is doing much better, pain is well controlled. Awaiting for physical therapy for disposition  Assessment & Plan: Fracture of the left femur Status post ORIF Pain management as needed PT per orthopedic team PT evaluation pending  COPD with acute exacerbation, chronic home oxygen 2 L nasal cannula - improved Patient back to baseline on oxygen supplementation Continue azithromycin, nebulizer, Continue prednisone 40 mg for total of 5 days Continue oxygen supplementation to keep oxygen saturation above 89%  Continue to monitor  Urinary retention - patient with Hx of overreactive  Had a foley placed overnight  On Myrbetriq - will d/c  Will do voiding trails   Hypertension BP stable Continue current regimen Monitor BP  Diabetes mellitus CBG's stable  Continue SSI  Monitor   Acute renal failure superimposed on stage III chronic kidney disease Creatinine has improved with hydration now around baseline Baseline Cr ~ 1.5 a year ago, likely progressive kidney disease Monitor kidney function   GERD Continue protonix   Tobacco abuse Tobacco cessation dicussed   DVT prophylaxis: Heparin sq  Code Status: Full code Family Communication: Discussed with daughter via phone call Disposition Plan: SNF in the next 24-48 hours  Consultants:   Orthopedic surgeon  Procedures:   None  Antimicrobials: Anti-infectives    Start     Dose/Rate  Route Frequency Ordered Stop   03/02/17 1000  azithromycin (ZITHROMAX) tablet 250 mg     250 mg Oral Daily 03/01/17 0152 03/06/17 0959   03/02/17 0600  clindamycin (CLEOCIN) 900 mg in dextrose 5 % 100 mL IVPB  Status:  Discontinued     900 mg 200 mL/hr over 30 Minutes Intravenous On call to O.R. 03/01/17 2314 03/01/17 2320   03/02/17 0100  clindamycin (CLEOCIN) IVPB 600 mg     600 mg 100 mL/hr over 30 Minutes Intravenous Every 6 hours 03/01/17 2315 03/03/17 1054   03/01/17 1913  clindamycin (CLEOCIN) 900 MG/50ML IVPB  Status:  Discontinued    Comments:  Decarlo, Valerie   : cabinet override      03/01/17 1913 03/01/17 2319   03/01/17 1000  azithromycin (ZITHROMAX) tablet 500 mg     500 mg Oral Daily 03/01/17 0152 03/02/17 0959         Objective: Vitals:   03/02/17 2200 03/03/17 0552 03/03/17 0856 03/03/17 1327  BP: (!) 108/46 (!) 128/58  126/70  Pulse: 81 72  74  Resp: (!) 21 20  19   Temp: 99.1 F (37.3 C) 98.1 F (36.7 C)  98.2 F (36.8 C)  TempSrc: Oral Oral  Oral  SpO2: 95% 99% 100% 98%  Weight:      Height:        Intake/Output Summary (Last 24 hours) at 03/03/17 1445 Last data filed at 03/03/17 0600  Gross per 24 hour  Intake          2659.25 ml  Output  1975 ml  Net           684.25 ml   Filed Weights   02/28/17 1731 03/01/17 1814  Weight: 68 kg (150 lb) 68 kg (150 lb)    Examination:  General: Pt is alert, awake, not in acute distress Cardiovascular: RRR, S1/S2 +, no rubs, no gallops Respiratory: Air entry has improved, no wheezing, no rhonchi Abdominal: Soft, NT, ND, bowel sounds + Extremities: Left leg with knee immobilizer   Data Reviewed: I have personally reviewed following labs and imaging studies  CBC:  Recent Labs Lab 02/28/17 2050 03/01/17 0131 03/02/17 0440 03/03/17 0652  WBC 8.8 8.8 6.7 5.3  NEUTROABS 5.3  --   --  3.8  HGB 10.4* 10.1* 8.8* 9.4*  HCT 31.3* 31.7* 28.0* 30.5*  MCV 90.7 91.4 93.3 92.7  PLT 252 216 167 172    Basic Metabolic Panel:  Recent Labs Lab 02/28/17 2050 03/01/17 0131 03/02/17 0440 03/03/17 0652  NA 138 136 137 134*  K 3.9 3.8 4.0 4.1  CL 107 106 109 104  CO2 22 23 24 24   GLUCOSE 106* 188* 141* 195*  BUN 42* 38* 26* 24*  CREATININE 2.18* 2.18* 1.78* 1.68*  CALCIUM 7.9* 7.8* 7.1* 7.2*   GFR: Estimated Creatinine Clearance: 30.7 mL/min (A) (by C-G formula based on SCr of 1.68 mg/dL (H)). Liver Function Tests:  Recent Labs Lab 02/28/17 2050 03/03/17 0652  AST 24 75*  ALT 23 123*  ALKPHOS 63 112  BILITOT 0.5 0.6  PROT 6.8 5.7*  ALBUMIN 3.8 2.9*   Coagulation Profile:  Recent Labs Lab 02/28/17 2050  INR 1.01   CBG:  Recent Labs Lab 03/01/17 2309 03/02/17 0747 03/02/17 2202 03/03/17 0742 03/03/17 1155  GLUCAP 119* 134* 264* 168* 136*    Recent Results (from the past 240 hour(s))  Surgical pcr screen     Status: None   Collection Time: 03/01/17  2:28 PM  Result Value Ref Range Status   MRSA, PCR NEGATIVE NEGATIVE Final   Staphylococcus aureus NEGATIVE NEGATIVE Final    Comment:        The Xpert SA Assay (FDA approved for NASAL specimens in patients over 70 years of age), is one component of a comprehensive surveillance program.  Test performance has been validated by [facility] for patients greater than or equal to 70 year old. It is not intended to diagnose infection nor to guide or monitor treatment.       Radiology Studies: Dg Ankle 2 Views Right  Result Date: 03/02/2017 CLINICAL DATA:  71 year old female with right ankle pain since falling while getting out of the swimming pool this past Wednesday EXAM: RIGHT ANKLE - 2 VIEW COMPARISON:  None. FINDINGS: There is no evidence of fracture, dislocation, or joint effusion. The soft tissue calcification overlies the distal fibular diaphysis. Mild degenerative bone spurring in the midfoot at the navicular cuneiform joint. Moderate-sized plantar calcaneal spur. Soft tissues are unremarkable.  IMPRESSION: 1. No evidence of acute fracture or malalignment. 2. Midfoot degenerative osteoarthritis. 3. Plantar calcaneal spur. Electronically Signed   By: Malachy MoanHeath  McCullough M.D.   On: 03/02/2017 12:50   Dg Knee Left Port  Result Date: 03/01/2017 CLINICAL DATA:  ORIF left femur fracture. EXAM: PORTABLE LEFT KNEE - 1-2 VIEW COMPARISON:  05/29/2017 CT FINDINGS: Two screws traverse the distal femur. The known distal femur fracture is difficult to visualize on this study. No complicating features noted. IMPRESSION: Internal fixation of distal femur. No complicating features identified. Electronically  Signed   By: Harmon Pier M.D.   On: 03/01/2017 22:02   Dg C-arm 1-60 Min  Result Date: 03/01/2017 CLINICAL DATA:  ORIF distal femur EXAM: LEFT FEMUR 2 VIEWS; DG C-ARM 61-120 MIN COMPARISON:  02/28/2017, 03/01/2017 FINDINGS: Two low resolution spot intraoperative views of the left femur. Total fluoroscopy time was 6 seconds. The images demonstrate 2 screw fixation of distal femoral fracture. IMPRESSION: Intraoperative fluoroscopic assistance provided during surgical fixation of distal femoral fracture Electronically Signed   By: Jasmine Pang M.D.   On: 03/01/2017 20:41   Dg Femur Min 2 Views Left  Result Date: 03/01/2017 CLINICAL DATA:  ORIF distal femur EXAM: LEFT FEMUR 2 VIEWS; DG C-ARM 61-120 MIN COMPARISON:  02/28/2017, 03/01/2017 FINDINGS: Two low resolution spot intraoperative views of the left femur. Total fluoroscopy time was 6 seconds. The images demonstrate 2 screw fixation of distal femoral fracture. IMPRESSION: Intraoperative fluoroscopic assistance provided during surgical fixation of distal femoral fracture Electronically Signed   By: Jasmine Pang M.D.   On: 03/01/2017 20:41     Scheduled Meds: . amLODipine  5 mg Oral Daily  . arformoterol  15 mcg Nebulization BID  . azithromycin  250 mg Oral Daily  . budesonide (PULMICORT) nebulizer solution  0.25 mg Nebulization BID  . calcium-vitamin  D  1 tablet Oral Daily  . cilostazol  50 mg Oral BID  . dextromethorphan-guaiFENesin  1 tablet Oral BID  . escitalopram  20 mg Oral Daily  . famotidine  20 mg Oral QHS  . gabapentin  100 mg Oral TID  . heparin  5,000 Units Subcutaneous Q8H  . insulin aspart  0-5 Units Subcutaneous QHS  . insulin aspart  0-9 Units Subcutaneous TID WC  . loratadine  10 mg Oral QPM  . metoprolol succinate  100 mg Oral Daily  . mirabegron ER  50 mg Oral Daily  . montelukast  10 mg Oral QHS  . multivitamin with minerals  1 tablet Oral Daily  . nicotine  21 mg Transdermal Daily  . pantoprazole  40 mg Oral Daily  . povidone-iodine  2 application Topical Once  . pravastatin  20 mg Oral Daily  . predniSONE  40 mg Oral Q breakfast  . sodium chloride  1 g Oral TID  . traZODone  100 mg Oral QHS   Continuous Infusions: . sodium chloride 75 mL/hr at 03/03/17 1219  . sodium chloride 10 mL/hr at 03/01/17 1831  . sodium chloride       LOS: 3 days    Time spent: Total of 15 minutes spent with pt, greater than 50% of which was spent in discussion of  treatment, counseling and coordination of care    Latrelle Dodrill, MD Pager: Text Page via [email or web address]  [phone]  If 7PM-7AM, please contact night-coverage [email or web address] Password [email or web address] 03/03/2017, 2:45 PM

## 2017-03-03 NOTE — Evaluation (Signed)
Physical Therapy Evaluation Patient Details Name: Anita Butler MRN: [id number] DOB: Sep 30, 1945 Today's Date: 03/03/2017   History of Present Illness  Pt is a 71 yo female admitted through ED on 02/28/17 with fall and left knee pain. Pt was diagnoised with L distal femure fx and underwent an ORIF on 03/01/17. Pt was also found to have worsening kidney function on admission. PMH significant for HTN, HLD, DM2, COPD on 2L at home, GERD, Depression, anxiety, tobacco abuse, CKD3.   Clinical Impression  Pt presents with the above diagnosis and below deficits for therapy evaluation. Prior to admission, pt lived alone in a single level apartment in [place] with her daughter living nearby. Pt was independent with mobility and most ADLs prior to admission. Currently, pt required Mod A for transfers and Min A for gait with RW to maintain TDWB. Pt will benefit from continued acute PT services in order to address the below deficits prior to discharge and will require continued services at SNF at discharge to maximize her outcomes before returning home.     Follow Up Recommendations SNF    Equipment Recommendations  Rolling walker with 5" wheels;3in1 (PT)    Recommendations for Other Services       Precautions / Restrictions Precautions Precautions: Fall Required Braces or Orthoses: Knee Immobilizer - Left Knee Immobilizer - Left: On at all times Restrictions Weight Bearing Restrictions: Yes LLE Weight Bearing: Touchdown weight bearing      Mobility  Bed Mobility               General bed mobility comments: Pt up in recliner when PT arrives  Transfers Overall transfer level: Needs assistance Equipment used: Rolling walker (2 wheeled) Transfers: Sit to/from Stand Sit to Stand: Mod assist         General transfer comment: Mod A to rise from lower recliner   Ambulation/Gait Ambulation/Gait assistance: Min assist;Min guard Ambulation Distance (Feet): 30 Feet Assistive device: Rolling  walker (2 wheeled) Gait Pattern/deviations: Step-to pattern;Decreased step length - right;Decreased stance time - left;Antalgic Gait velocity: decreased Gait velocity interpretation: Below normal speed for age/gender General Gait Details: Moderate antalgic gait with cues to maintain TDWB on LLE. Min A progressed to Min guard as patient became more comfortable with mobility  Stairs            Wheelchair Mobility    Modified Rankin (Stroke Patients Only)       Balance Overall balance assessment: Needs assistance Sitting-balance support: No upper extremity supported;Feet supported Sitting balance-Leahy Scale: Fair     Standing balance support: Bilateral upper extremity supported Standing balance-Leahy Scale: Poor Standing balance comment: reliant on RW for stability in standing                             Pertinent Vitals/Pain Pain Assessment: Faces Faces Pain Scale: Hurts even more Pain Location: left knee with movement Pain Descriptors / Indicators: Grimacing;Guarding Pain Intervention(s): Monitored during session;Premedicated before session;Repositioned;Ice applied    Home Living Family/patient expects to be discharged to:: Private residence Living Arrangements: Alone Available Help at Discharge: Family;Available PRN/intermittently Type of Home: Apartment Home Access: Stairs to enter   Entrance Stairs-Number of Steps: 1 step onto and 1 step into house Home Layout: One level Home Equipment: Cane - single point;Walker - 4 wheels Additional Comments: pt has had multiple falls in the past few years. Pt lives in [place] and all demographics are from home in [place]  Prior Function Level of Independence: Independent with assistive device(s)         Comments: used a Crook for mobility     Hand Dominance   Dominant Hand: Right    Extremity/Trunk Assessment   Upper Extremity Assessment Upper Extremity Assessment: Defer to OT evaluation    Lower  Extremity Assessment Lower Extremity Assessment: LLE deficits/detail LLE Deficits / Details: Normal post op pain and weakness. At least 3/5 ankle and 2/5 knee and hip per gross functional assessment    Cervical / Trunk Assessment Cervical / Trunk Assessment: Normal  Communication   Communication: No difficulties  Cognition Arousal/Alertness: Awake/alert Behavior During Therapy: WFL for tasks assessed/performed Overall Cognitive Status: Within Functional Limits for tasks assessed                                        General Comments General comments (skin integrity, edema, etc.): 2L of O2 used throughout session. O2 sats 95% after gait. Only uses O2 at night at home    Exercises     Assessment/Plan    PT Assessment Patient needs continued PT services  PT Problem List Decreased strength;Decreased range of motion;Decreased activity tolerance;Decreased balance;Decreased mobility;Decreased knowledge of use of DME;Pain       PT Treatment Interventions DME instruction;Gait training;Functional mobility training;Therapeutic activities;Therapeutic exercise;Balance training;Patient/family education    PT Goals (Current goals can be found in the Care Plan section)  Acute Rehab PT Goals Patient Stated Goal: to get back home PT Goal Formulation: With patient/family Time For Goal Achievement: 03/17/17 Potential to Achieve Goals: Good    Frequency Min 3X/week   Barriers to discharge        Co-evaluation               AM-PAC PT "6 Clicks" Daily Activity  Outcome Measure Difficulty turning over in bed (including adjusting bedclothes, sheets and blankets)?: Total Difficulty moving from lying on back to sitting on the side of the bed? : Total Difficulty sitting down on and standing up from a chair with arms (e.g., wheelchair, bedside commode, etc,.)?: Total Help needed moving to and from a bed to chair (including a wheelchair)?: A Lot Help needed walking in  hospital room?: A Lot Help needed climbing 3-5 steps with a railing? : Total 6 Click Score: 8    End of Session Equipment Utilized During Treatment: Gait belt;Oxygen Activity Tolerance: Patient tolerated treatment well Patient left: in chair;with call bell/phone within reach;with family/visitor present Nurse Communication: Mobility status PT Visit Diagnosis: Unsteadiness on feet (R26.81);Muscle weakness (generalized) (M62.81);Difficulty in walking, not elsewhere classified (R26.2);Pain Pain - Right/Left: Left Pain - part of body: Knee    Time: [id number] PT Time Calculation (min) (ACUTE ONLY): 32 min   Charges:   PT Evaluation $PT Eval Moderate Complexity: 1 Procedure PT Treatments $Gait Training: 8-22 mins   PT G Codes:        Colin BroachSabra M. Bertil Brickey PT, DPT  [phone]   Roxy MannsSabra Marie Delvis Kau 03/03/2017, 4:28 PM

## 2017-03-03 NOTE — Progress Notes (Signed)
Subjective: 2 Days Post-Op Procedure(s) (LRB): OPEN REDUCTION INTERNAL FIXATION (ORIF) DISTAL FEMUR FRACTURE (Left) Patient reports pain as mild.    Objective: Vital signs in last 24 hours: Temp:  [98.1 F (36.7 C)-99.1 F (37.3 C)] 98.2 F (36.8 C) (07/01 1327) Pulse Rate:  [72-81] 74 (07/01 1327) Resp:  [19-21] 19 (07/01 1327) BP: (108-128)/(46-70) 126/70 (07/01 1327) SpO2:  [95 %-100 %] 98 % (07/01 1327)  Intake/Output from previous day: 06/30 0701 - 07/01 0700 In: 2659.3 [P.O.:980; I.V.:1679.3] Out: 1975 [Urine:1975] Intake/Output this shift: Total I/O In: 236 [I.V.:236] Out: 1250 [Urine:1250]   Recent Labs  02/28/17 2050 03/01/17 0131 03/02/17 0440 03/03/17 0652  HGB 10.4* 10.1* 8.8* 9.4*    Recent Labs  03/02/17 0440 03/03/17 0652  WBC 6.7 5.3  RBC 3.00* 3.29*  HCT 28.0* 30.5*  PLT 167 172    Recent Labs  03/02/17 0440 03/03/17 0652  NA 137 134*  K 4.0 4.1  CL 109 104  CO2 24 24  BUN 26* 24*  CREATININE 1.78* 1.68*  GLUCOSE 141* 195*  CALCIUM 7.1* 7.2*    Recent Labs  02/28/17 2050  INR 1.01    Neurovascular intact Sensation intact distally Intact pulses distally Dorsiflexion/Plantar flexion intact Incision: dressing C/D/I Compartment soft  Assessment/Plan: 2 Days Post-Op Procedure(s) (LRB): OPEN REDUCTION INTERNAL FIXATION (ORIF) DISTAL FEMUR FRACTURE (Left) Up with therapy  TDWB LLE Gait training PT D/c planning per primary team dsg change prn  Anita Butler 03/03/2017, 0900

## 2017-03-04 ENCOUNTER — Encounter (HOSPITAL_COMMUNITY): Payer: Self-pay | Admitting: Orthopedic Surgery

## 2017-03-04 LAB — CBC WITH DIFFERENTIAL/PLATELET
BASOS ABS: 0 10*3/uL (ref 0.0–0.1)
Basophils Relative: 0 %
Eosinophils Absolute: 0 10*3/uL (ref 0.0–0.7)
Eosinophils Relative: 0 %
HEMATOCRIT: 27.3 % — AB (ref 36.0–46.0)
HEMOGLOBIN: 8.6 g/dL — AB (ref 12.0–15.0)
LYMPHS ABS: 1.7 10*3/uL (ref 0.7–4.0)
LYMPHS PCT: 25 %
MCH: 29 pg (ref 26.0–34.0)
MCHC: 31.5 g/dL (ref 30.0–36.0)
MCV: 91.9 fL (ref 78.0–100.0)
Monocytes Absolute: 0.6 10*3/uL (ref 0.1–1.0)
Monocytes Relative: 9 %
NEUTROS ABS: 4.3 10*3/uL (ref 1.7–7.7)
NEUTROS PCT: 66 %
PLATELETS: 172 10*3/uL (ref 150–400)
RBC: 2.97 MIL/uL — AB (ref 3.87–5.11)
RDW: 17.3 % — ABNORMAL HIGH (ref 11.5–15.5)
WBC: 6.7 10*3/uL (ref 4.0–10.5)

## 2017-03-04 LAB — BASIC METABOLIC PANEL
ANION GAP: 3 — AB (ref 5–15)
BUN: 26 mg/dL — ABNORMAL HIGH (ref 6–20)
CHLORIDE: 108 mmol/L (ref 101–111)
CO2: 25 mmol/L (ref 22–32)
Calcium: 7.3 mg/dL — ABNORMAL LOW (ref 8.9–10.3)
Creatinine, Ser: 1.59 mg/dL — ABNORMAL HIGH (ref 0.44–1.00)
GFR calc Af Amer: 37 mL/min — ABNORMAL LOW (ref >60)
GFR, EST NON AFRICAN AMERICAN: 32 mL/min — AB (ref >60)
Glucose, Bld: 151 mg/dL — ABNORMAL HIGH (ref 65–99)
POTASSIUM: 4.1 mmol/L (ref 3.5–5.1)
SODIUM: 136 mmol/L (ref 135–145)

## 2017-03-04 LAB — GLUCOSE, CAPILLARY
GLUCOSE-CAPILLARY: 184 mg/dL — AB (ref 65–99)
Glucose-Capillary: 134 mg/dL — ABNORMAL HIGH (ref 65–99)
Glucose-Capillary: 166 mg/dL — ABNORMAL HIGH (ref 65–99)
Glucose-Capillary: 265 mg/dL — ABNORMAL HIGH (ref 65–99)

## 2017-03-04 MED ORDER — HYDROCODONE-ACETAMINOPHEN 5-325 MG PO TABS: 1.0000 | ORAL_TABLET | Freq: Four times a day (QID) | ORAL | 0 refills | Status: AC | PRN

## 2017-03-04 MED ORDER — AZITHROMYCIN 250 MG PO TABS: 250.0000 mg | ORAL_TABLET | Freq: Once | ORAL | 0 refills | Status: AC

## 2017-03-04 MED ORDER — UMECLIDINIUM-VILANTEROL 62.5-25 MCG/INH IN AEPB: 1.0000 | INHALATION_SPRAY | Freq: Every day | RESPIRATORY_TRACT | 0 refills | Status: AC

## 2017-03-04 MED ORDER — METHOCARBAMOL 500 MG PO TABS: 500.0000 mg | ORAL_TABLET | Freq: Three times a day (TID) | ORAL | 0 refills | Status: AC | PRN

## 2017-03-04 MED ORDER — ALBUTEROL SULFATE HFA 108 (90 BASE) MCG/ACT IN AERS: 2.0000 | INHALATION_SPRAY | Freq: Four times a day (QID) | RESPIRATORY_TRACT | 0 refills | Status: AC | PRN

## 2017-03-04 MED ORDER — PREDNISONE 20 MG PO TABS: 40.0000 mg | ORAL_TABLET | Freq: Every day | ORAL | 0 refills | Status: AC

## 2017-03-04 NOTE — Progress Notes (Signed)
Physical Therapy Treatment Patient Details Name: Anita Butler MRN: [id number] DOB: 1946/07/24 Today's Date: 03/04/2017    History of Present Illness Pt is a 71 yo female admitted through ED on 02/28/17 with fall and left knee pain. Pt was diagnoised with L distal femure fx and underwent an ORIF on 03/01/17. Pt was also found to have worsening kidney function on admission. PMH significant for HTN, HLD, DM2, COPD on 2L at home, GERD, Depression, anxiety, tobacco abuse, CKD3.     PT Comments    Pt with improved ambulation tolerance however remains questionable if she is maintaining L LE TWB. Many v/c's given. Acute PT to con't to follow.   Follow Up Recommendations  SNF     Equipment Recommendations  Rolling walker with 5" wheels;3in1 (PT)    Recommendations for Other Services       Precautions / Restrictions Precautions Precautions: Fall Required Braces or Orthoses: Knee Immobilizer - Left Knee Immobilizer - Left: On at all times Restrictions Weight Bearing Restrictions: Yes LLE Weight Bearing: Touchdown weight bearing    Mobility  Bed Mobility Overal bed mobility: Needs Assistance Bed Mobility: Sit to Supine       Sit to supine: Min assist   General bed mobility comments: minA for L LE management back into bed  Transfers Overall transfer level: Needs assistance Equipment used: Rolling walker (2 wheeled) Transfers: Sit to/from Stand Sit to Stand: Min guard         General transfer comment: v/c's for hand placement and to maintain L LE TTWB  Ambulation/Gait Ambulation/Gait assistance: Min guard Ambulation Distance (Feet): 65 Feet (x2) Assistive device: Rolling walker (2 wheeled) Gait Pattern/deviations: Step-to pattern;Decreased step length - right;Decreased stance time - left;Antalgic Gait velocity: decreased Gait velocity interpretation: Below normal speed for age/gender General Gait Details: v/c's to maintain L LE TWB t/o session, pt sat and took a rest break  for 3 min. SpO2 >86% on RA   Stairs            Wheelchair Mobility    Modified Rankin (Stroke Patients Only)       Balance Overall balance assessment: Needs assistance Sitting-balance support: No upper extremity supported;Feet supported Sitting balance-Leahy Scale: Fair     Standing balance support: No upper extremity supported;During functional activity Standing balance-Leahy Scale: Fair Standing balance comment: pt able to stand at sink and wash hands, it appears L LE was TWB                            Cognition Arousal/Alertness: Awake/alert Behavior During Therapy: WFL for tasks assessed/performed Overall Cognitive Status: Within Functional Limits for tasks assessed                                 General Comments: slightly impulsive, but safe      Exercises      General Comments        Pertinent Vitals/Pain Pain Assessment: Faces Faces Pain Scale: Hurts little more Pain Location: left knee with movement Pain Descriptors / Indicators: Grimacing;Guarding Pain Intervention(s): Monitored during session    Home Living                      Prior Function            PT Goals (current goals can now be found in the care plan section) Acute Rehab PT  Goals Patient Stated Goal: get out of here today Progress towards PT goals: Progressing toward goals    Frequency    Min 3X/week      PT Plan Current plan remains appropriate    Co-evaluation              AM-PAC PT "6 Clicks" Daily Activity  Outcome Measure  Difficulty turning over in bed (including adjusting bedclothes, sheets and blankets)?: A Little Difficulty moving from lying on back to sitting on the side of the bed? : A Little Difficulty sitting down on and standing up from a chair with arms (e.g., wheelchair, bedside commode, etc,.)?: A Little Help needed moving to and from a bed to chair (including a wheelchair)?: A Little Help needed walking in  hospital room?: A Little Help needed climbing 3-5 steps with a railing? : A Lot 6 Click Score: 17    End of Session Equipment Utilized During Treatment: Gait belt Activity Tolerance: Patient tolerated treatment well Patient left: in bed;with call bell/phone within reach Nurse Communication: Mobility status PT Visit Diagnosis: Unsteadiness on feet (R26.81);Muscle weakness (generalized) (M62.81);Difficulty in walking, not elsewhere classified (R26.2);Pain Pain - Right/Left: Left Pain - part of body: Knee     Time: 1610-9604 PT Time Calculation (min) (ACUTE ONLY): 20 min  Charges:  $Gait Training: 8-22 mins                    G Codes:     Lewis Shock, PT, DPT Pager #: [phone] Office #: [phone]    Kamarie Veno M Osama Coleson 03/04/2017, 2:21 PM

## 2017-03-04 NOTE — [place] FL2 (Signed)
[place] MEDICAID FL2 LEVEL OF CARE SCREENING TOOL     IDENTIFICATION  Patient Name: Anita Butler Birthdate: 1945/11/18 Sex: female Admission Date (Current Location): 02/28/2017  [facility]   Facility and Address:  [facility], [address], [place], [place] [address]      Provider Number: [id number]  Attending Physician Name and Address:  Lenox PondsSilva Zapata, Edwin, MD  Relative Name and Phone Number:  Daughter-Holley Pricilla Holmucker [phone]    Current Level of Care: Hospital Recommended Level of Care: Skilled Nursing Facility Prior Approval Number:    Date Approved/Denied:   PASRR Number:    Discharge Plan: SNF    Current Diagnoses: Patient Active Problem List   Diagnosis Date Noted  . Acute renal failure superimposed on stage 3 chronic kidney disease (HCC) 03/01/2017  . Hypocalcemia 03/01/2017  . Tobacco abuse 03/01/2017  . Hypertension   . Hypercholesterolemia   . GERD (gastroesophageal reflux disease)   . Diabetes mellitus without complication (HCC)   . COPD with acute exacerbation (HCC)   . Fracture of left femur (HCC) 02/28/2017    Orientation RESPIRATION BLADDER Height & Weight     Self, Time, Situation, Place  O2 (3L) Continent Weight: 150 lb (68 kg) Height:  5\' 7"  (170.2 cm)  BEHAVIORAL SYMPTOMS/MOOD NEUROLOGICAL BOWEL NUTRITION STATUS      Continent Diet (Heart healthy/carb modified; thin fluids)  AMBULATORY STATUS COMMUNICATION OF NEEDS Skin   Limited Assist Verbally Normal (Closed incision on left thigh; compression wrap)                       Personal Care Assistance Level of Assistance  Bathing, Feeding, Dressing Bathing Assistance: Limited assistance Feeding assistance: Independent Dressing Assistance: Limited assistance     Functional Limitations Info  Sight, Hearing, Speech Sight Info: Adequate Hearing Info: Adequate Speech Info: Adequate    SPECIAL CARE FACTORS FREQUENCY  PT (By  licensed PT)     PT Frequency: 5x              Contractures Contractures Info: Not present    Additional Factors Info  Code Status, Allergies, Psychotropic, Insulin Sliding Scale Code Status Info: Full Allergies Info: Keflex Cephalexin Psychotropic Info: escitalopram (LEXAPRO) Insulin Sliding Scale Info: See med list       Current Medications (03/04/2017):  This is the current hospital active medication list Current Facility-Administered Medications  Medication Dose Route Frequency Provider Last Rate Last Dose  . 0.9 %  sodium chloride infusion   Intravenous Continuous Val EagleMoser, Christopher, MD 10 mL/hr at 03/01/17 1831    . 0.9 %  sodium chloride infusion   Intravenous Continuous Kipp BroodJoslin, David, MD      . acetaminophen (TYLENOL) tablet 650 mg  650 mg Oral Q6H PRN Sheral ApleyMurphy, Timothy D, MD       Or  . acetaminophen (TYLENOL) suppository 650 mg  650 mg Rectal Q6H PRN Sheral ApleyMurphy, Timothy D, MD      . albuterol (PROVENTIL) (2.5 MG/3ML) 0.083% nebulizer solution 2.5 mg  2.5 mg Nebulization Q4H PRN Lorretta HarpNiu, Xilin, MD      . amLODipine (NORVASC) tablet 5 mg  5 mg Oral Daily Lorretta HarpNiu, Xilin, MD   5 mg at 03/04/17 1009  . arformoterol (BROVANA) nebulizer solution 15 mcg  15 mcg Nebulization BID Randel PiggSilva Zapata, Dorma RussellEdwin, MD   15 mcg at 03/04/17 [phone]  . azithromycin (ZITHROMAX) tablet 250 mg  250 mg Oral Daily Lorretta HarpNiu, Xilin, MD  250 mg at 03/04/17 1010  . budesonide (PULMICORT) nebulizer solution 0.25 mg  0.25 mg Nebulization BID Randel Pigg, Dorma Russell, MD   0.25 mg at 03/04/17 0826  . calcium-vitamin D (OSCAL WITH D) 500-200 MG-UNIT per tablet 1 tablet  1 tablet Oral Daily Lorretta Harp, MD   1 tablet at 03/04/17 1009  . cilostazol (PLETAL) tablet 50 mg  50 mg Oral BID Lorretta Harp, MD   50 mg at 03/04/17 1031  . dextromethorphan-guaiFENesin (MUCINEX DM) 30-600 MG per 12 hr tablet 1 tablet  1 tablet Oral BID Lorretta Harp, MD   1 tablet at 03/04/17 1009  . escitalopram (LEXAPRO) tablet 20 mg  20 mg Oral Daily Lorretta Harp, MD   20  mg at 03/04/17 1009  . famotidine (PEPCID) tablet 20 mg  20 mg Oral QHS Renaee Munda, RPH   20 mg at 03/03/17 2311  . gabapentin (NEURONTIN) capsule 100 mg  100 mg Oral TID Lorretta Harp, MD   100 mg at 03/04/17 1009  . heparin injection 5,000 Units  5,000 Units Subcutaneous Q8H Randel Pigg, Dorma Russell, MD   5,000 Units at 03/04/17 [phone]  . hydrALAZINE (APRESOLINE) injection 5 mg  5 mg Intravenous Q2H PRN Lorretta Harp, MD      . HYDROcodone-acetaminophen (NORCO/VICODIN) 5-325 MG per tablet 1-2 tablet  1-2 tablet Oral Q6H PRN Lorretta Harp, MD   2 tablet at 03/03/17 1701  . insulin aspart (novoLOG) injection 0-5 Units  0-5 Units Subcutaneous QHS Lorretta Harp, MD   3 Units at 03/02/17 2219  . insulin aspart (novoLOG) injection 0-9 Units  0-9 Units Subcutaneous TID WC Lorretta Harp, MD   1 Units at 03/04/17 [phone]  . ipratropium-albuterol (DUONEB) 0.5-2.5 (3) MG/3ML nebulizer solution 3 mL  3 mL Nebulization Q4H PRN Randel Pigg, Dorma Russell, MD      . loratadine (CLARITIN) tablet 10 mg  10 mg Oral QPM Lorretta Harp, MD   10 mg at 03/03/17 1704  . methocarbamol (ROBAXIN) tablet 500 mg  500 mg Oral Q8H PRN Lorretta Harp, MD   500 mg at 03/03/17 1317  . metoprolol succinate (TOPROL-XL) 24 hr tablet 100 mg  100 mg Oral Daily Lorretta Harp, MD   100 mg at 03/04/17 1009  . montelukast (SINGULAIR) tablet 10 mg  10 mg Oral QHS Lorretta Harp, MD   10 mg at 03/03/17 2312  . morphine 4 MG/ML injection 1 mg  1 mg Intravenous Q2H PRN Lorretta Harp, MD   1 mg at 03/02/17 1205  . multivitamin with minerals tablet 1 tablet  1 tablet Oral Daily Lorretta Harp, MD   1 tablet at 03/04/17 1009  . nicotine (NICODERM CQ - dosed in mg/24 hours) patch 21 mg  21 mg Transdermal Daily Lorretta Harp, MD   21 mg at 03/04/17 1009  . ondansetron (ZOFRAN) injection 4 mg  4 mg Intravenous Q8H PRN Lorretta Harp, MD      . pantoprazole (PROTONIX) EC tablet 40 mg  40 mg Oral Daily Lorretta Harp, MD   40 mg at 03/04/17 1010  . povidone-iodine 10 % swab 2 application  2 application  Topical Once Freeman Caldron, PA-C      . pravastatin (PRAVACHOL) tablet 20 mg  20 mg Oral Daily Lorretta Harp, MD   20 mg at 03/03/17 1704  . predniSONE (DELTASONE) tablet 40 mg  40 mg Oral Q breakfast Randel Pigg, Dorma Russell, MD   40 mg at 03/04/17 0845  . senna-docusate (Senokot-S) tablet 1 tablet  1 tablet Oral QHS PRN Lorretta Harp, MD      . sodium chloride tablet 1 g  1 g Oral TID Lorretta Harp, MD   1 g at 03/03/17 2313  . traZODone (DESYREL) tablet 100 mg  100 mg Oral QHS Lorretta Harp, MD   100 mg at 03/03/17 2315     Discharge Medications: Please see discharge summary for a list of discharge medications.  Relevant Imaging Results:  Relevant Lab Results:   Additional Information SSN: [id number]  Dominic Pea, LCSW

## 2017-03-04 NOTE — Clinical Social Work Note (Signed)
CSW met with pt and dtrs at bedside at length. Pt lives in [place]. Pt agreeable to SNF placement and chose [facility]. Pt awaiting a PASRR number due to out of state status and cannot go to SNF unti number received. Pt will discharge Tuesday AM, once PASRR received, to [facility]. CSW updated MD,RN, and pt/pt family. CSW assessment to follow. CSW continuing to follow for family support and pt disposition.    Oretha Ellis, [place], [place] Work [phone]

## 2017-03-04 NOTE — Discharge Summary (Signed)
Physician Discharge Summary  Anita Butler  ZOX:[id number]  DOB: 1946/03/28  DOA: 02/28/2017 PCP: System, Pcp Not In  Admit date: 02/28/2017 Discharge date: 03/04/2017  Admitted From: Home  Disposition:  SNF   Recommendations for Outpatient Follow-up:  1. Follow up with PCP in 1-2 weeks 2. Please obtain BMP/CBC in one week to monitor renal function and Hgb  3. Follow up with Dr Wandra Feinstein in 2 weeks   Equipment/Devices: O2 2L [place]   Discharge Condition: Stable  CODE STATUS: FULL  Diet recommendation: Heart Healthy / Carb Modified / Regular / Dysphagia   Brief/Interim Summary: 71 y/o F with PMHx of HTN, Type II DM, COPD on home oxygen, tobacco abuse, GERD, depression, anxiety and CKD stage 3. Presented to ED with left knee injury due to fall. Patient is admitted for a non displaced distal femoral fracture, also was found to have mild COPD exacerbation. Patient went for ORIF and was treated with nebulizer and predinose for COPD. Patient has clinically improved. Breathing is back to baseline, she is up with physical therapy recommending SNF for short-term rehabilitation. Patient clinically stable to be discharged to skilled nursing facility.  Subjective: Patient seen and examined on the day of discharge. She continues to improve feeling much better. Report today's visit she has been in a while. Some leg discomfort but tolerable. No acute events overnight, patient remains afebrile, tolerating diet well and ambulated with physical therapy.  Discharge Diagnoses/Hospital Course:  Fracture of the left femur Status post ORIF 03/01/17 Pain management as needed Follow-up with Dr. Eulah Pont in 2 weeks Short-term rehabilitation  COPD with acute exacerbation, chronic home oxygen 2 L nasal cannula - improved Patient back to baseline on oxygen supplementation Patient treated with azithromycin last dose will be tomorrow 03/05/2017 She was treated with nebulizer, Brovana, Pulmicort and DuoNeb Burst of  prednisone 40 mg for total of 5 days Continue oxygen supplementation to keep oxygen saturation above 89%  Patient discharged on Anoro daily and albuterol PRN   Urinary retention - patient with Hx of overreactive  Foley cath removed, with good urine output  Myrbetriq d/ced for now   Hypertension Patient initially with multiple medications for high blood pressure which include metoprolol, hydrochlorothiazide, benazepril and losartan. Adjustment of medications are as follows patient will continue with metoprolol 100 mg daily and benazepril 40 mg daily. Will discontinue losartan osseous and duplicate opiates hematoma. Also discontinue hydrochlorothiazide as patient has history of CKD Blood pressures have been stable during hospital stay. Monitor blood pressure and adjust medications as needed.  Diabetes mellitus CBGs were stable during hospital stay Continue home medications, no adjustment were made. Monitor CBGs goal less than 140 fasting Follow-up with PCP  Acute renal failure superimposed on stage III chronic kidney disease Creatinine has improved with hydration now around baseline Baseline Cr ~ 1.5 a year ago, likely progressive kidney disease Monitor BMP in 1 week  GERD Continue protonix   Tobacco abuse Tobacco cessation dicussed   All other chronic medical condition were stable during the hospitalization.  Patient was seen by physical therapy, recommending skilled nursing facility for short-term rehabilitation. On the day of the discharge the patient's vitals were stable, and no other acute medical condition were reported by patient. Patient was felt safe to be discharge to SNF  Discharge Instructions  You were cared for by a hospitalist during your hospital stay. If you have any questions about your discharge medications or the care you received while you were in the hospital after you  are discharged, you can call the unit and asked to speak with the hospitalist on call  if the hospitalist that took care of you is not available. Once you are discharged, your primary care physician will handle any further medical issues. Please note that NO REFILLS for any discharge medications will be authorized once you are discharged, as it is imperative that you return to your primary care physician (or establish a relationship with a primary care physician if you do not have one) for your aftercare needs so that they can reassess your need for medications and monitor your lab values.  Discharge Instructions    (HEART FAILURE PATIENTS) Call MD:  Anytime you have any of the following symptoms: 1) 3 pound weight gain in 24 hours or 5 pounds in 1 week 2) shortness of breath, with or without a dry hacking cough 3) swelling in the hands, feet or stomach 4) if you have to sleep on extra pillows at night in order to breathe.    Complete by:  As directed    Call MD for:  difficulty breathing, headache or visual disturbances    Complete by:  As directed    Call MD for:  extreme fatigue    Complete by:  As directed    Call MD for:  hives    Complete by:  As directed    Call MD for:  persistant dizziness or light-headedness    Complete by:  As directed    Call MD for:  persistant nausea and vomiting    Complete by:  As directed    Call MD for:  redness, tenderness, or signs of infection (pain, swelling, redness, odor or green/yellow discharge around incision site)    Complete by:  As directed    Call MD for:  severe uncontrolled pain    Complete by:  As directed    Call MD for:  temperature >100.4    Complete by:  As directed    Diet - low sodium heart healthy    Complete by:  As directed    Increase activity slowly    Complete by:  As directed      Allergies as of 03/04/2017      Reactions   Keflex [cephalexin] Other (See Comments)   Mental health issue       Medication List    STOP taking these medications   hydrochlorothiazide 25 MG tablet Commonly known as:   HYDRODIURIL   losartan 50 MG tablet Commonly known as:  COZAAR   MYRBETRIQ 50 MG Tb24 tablet Generic drug:  mirabegron ER   omeprazole 20 MG capsule Commonly known as:  PRILOSEC     TAKE these medications   albuterol 108 (90 Base) MCG/ACT inhaler Commonly known as:  PROVENTIL HFA;VENTOLIN HFA Inhale 2 puffs into the lungs every 6 (six) hours as needed for wheezing or shortness of breath.   alendronate 70 MG tablet Commonly known as:  FOSAMAX Take 70 mg by mouth once a week. Take with a full glass of water on an empty stomach.   amLODipine 5 MG tablet Commonly known as:  NORVASC Take 5 mg by mouth daily.   azithromycin 250 MG tablet Commonly known as:  ZITHROMAX Take 1 tablet (250 mg total) by mouth once. Start taking on:  03/05/2017   benazepril 40 MG tablet Commonly known as:  LOTENSIN Take 40 mg by mouth daily.   CALCIUM 1200 PO Take 1 tablet by mouth daily.   cilostazol 50  MG tablet Commonly known as:  PLETAL Take 50 mg by mouth 2 (two) times daily.   escitalopram 20 MG tablet Commonly known as:  LEXAPRO Take 20 mg by mouth daily.   gabapentin 100 MG capsule Commonly known as:  NEURONTIN Take 100 mg by mouth 3 (three) times daily.   GLIPIZIDE XL 2.5 MG 24 hr tablet Generic drug:  glipiZIDE Take 2.5 mg by mouth daily with breakfast.   HYDROcodone-acetaminophen 5-325 MG tablet Commonly known as:  NORCO/VICODIN Take 1 tablet by mouth every 6 (six) hours as needed for moderate pain.   levocetirizine 5 MG tablet Commonly known as:  XYZAL Take 5 mg by mouth every evening.   methocarbamol 500 MG tablet Commonly known as:  ROBAXIN Take 1 tablet (500 mg total) by mouth every 8 (eight) hours as needed for muscle spasms.   metoprolol succinate 100 MG 24 hr tablet Commonly known as:  TOPROL-XL Take 100 mg by mouth daily. Take with or immediately following a meal.   montelukast 10 MG tablet Commonly known as:  SINGULAIR Take 10 mg by mouth at bedtime.    multivitamin tablet Take 1 tablet by mouth daily.   pravastatin 20 MG tablet Commonly known as:  PRAVACHOL Take 20 mg by mouth daily.   predniSONE 20 MG tablet Commonly known as:  DELTASONE Take 2 tablets (40 mg total) by mouth daily with breakfast. Start taking on:  03/05/2017   ranitidine 300 MG tablet Commonly known as:  ZANTAC Take 300 mg by mouth at bedtime.   sodium chloride 1 g tablet Take 1 g by mouth 3 (three) times daily.   traZODone 100 MG tablet Commonly known as:  DESYREL Take 100 mg by mouth at bedtime.   umeclidinium-vilanterol 62.5-25 MCG/INH Aepb Commonly known as:  ANORO ELLIPTA Inhale 1 puff into the lungs daily.       Allergies  Allergen Reactions  . Keflex [Cephalexin] Other (See Comments)    Mental health issue     Consultations:  Orthopedic surgery   Procedures/Studies: Dg Ankle 2 Views Right  Result Date: 03/02/2017 CLINICAL DATA:  71 year old female with right ankle pain since falling while getting out of the swimming pool this past Wednesday EXAM: RIGHT ANKLE - 2 VIEW COMPARISON:  None. FINDINGS: There is no evidence of fracture, dislocation, or joint effusion. The soft tissue calcification overlies the distal fibular diaphysis. Mild degenerative bone spurring in the midfoot at the navicular cuneiform joint. Moderate-sized plantar calcaneal spur. Soft tissues are unremarkable. IMPRESSION: 1. No evidence of acute fracture or malalignment. 2. Midfoot degenerative osteoarthritis. 3. Plantar calcaneal spur. Electronically Signed   By: Malachy Moan M.D.   On: 03/02/2017 12:50   Ct Femur Left Wo Contrast  Result Date: 03/01/2017 CLINICAL DATA:  Slipped and fell getting out of the a pool. EXAM: CT OF THE LOWER LEFT EXTREMITY WITHOUT CONTRAST TECHNIQUE: Multidetector CT imaging of the lower left extremity was performed according to the standard protocol. COMPARISON:  Radiographs 6288 FINDINGS: There is a sagittally oriented fracture through the  notch aspect of the lateral femoral condyles with minimal step-off at the articular surface. There is continuation of a nondisplaced fracture line up the posterior diaphyseal cortex, approximately 8 cm above the knee joint line. Articular relationships are maintained at the knee. Proximal tibia is intact. Patella is intact. Large lipohemarthrosis. IMPRESSION: Minimally displaced sagittal fracture through the notch aspect of the lateral femoral condyle. Large lipohemarthrosis. Electronically Signed   By: Rosey Bath.D.  On: 03/01/2017 03:48   Dg Chest Port 1 View  Result Date: 02/28/2017 CLINICAL DATA:  Left femoral fracture.  Preoperative imaging. EXAM: PORTABLE CHEST 1 VIEW COMPARISON:  None. FINDINGS: Generalized interstitial coarsening. Probable upper lobe emphysematous disease. No airspace consolidation. No pneumothorax. No effusion. Normal heart size. Unremarkable hilar and mediastinal contours. 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 No consolidation or effusion. Electronically Signed   By: Ellery Plunk M.D.   On: 02/28/2017 21:25   Dg Knee Complete 4 Views Left  Result Date: 02/28/2017 CLINICAL DATA:  Fall at school today, complaining of left knee pain EXAM: LEFT KNEE - COMPLETE 4+ VIEW COMPARISON:  None. FINDINGS: Moderate lipo hemarthrosis. Linear lucency/nondisplaced fracture extending vertically through the distal shaft, metaphysis and epiphysis of the distal femur with fracture extension to the articular surface, just lateral to the intercondylar region. Mild impaction of the distal femoral metaphysis at the lateral condyles. No other discrete fracture is visualized. Mild degenerative changes of the medial compartment. Vascular calcification IMPRESSION: 1. Nondisplaced distal femoral fracture with extension to the femoral articular surface, just lateral to midline. Mild impaction of the lateral metaphyseal cortex at the femoral condyles. 2. No  dislocation 3. Moderate large lipo hemarthrosis Electronically Signed   By: Jasmine Pang M.D.   On: 02/28/2017 18:57   Dg Knee Left Port  Result Date: 03/01/2017 CLINICAL DATA:  ORIF left femur fracture. EXAM: PORTABLE LEFT KNEE - 1-2 VIEW COMPARISON:  05/29/2017 CT FINDINGS: Two screws traverse the distal femur. The known distal femur fracture is difficult to visualize on this study. No complicating features noted. IMPRESSION: Internal fixation of distal femur. No complicating features identified. Electronically Signed   By: Harmon Pier M.D.   On: 03/01/2017 22:02   Dg C-arm 1-60 Min  Result Date: 03/01/2017 CLINICAL DATA:  ORIF distal femur EXAM: LEFT FEMUR 2 VIEWS; DG C-ARM 61-120 MIN COMPARISON:  02/28/2017, 03/01/2017 FINDINGS: Two low resolution spot intraoperative views of the left femur. Total fluoroscopy time was 6 seconds. The images demonstrate 2 screw fixation of distal femoral fracture. IMPRESSION: Intraoperative fluoroscopic assistance provided during surgical fixation of distal femoral fracture Electronically Signed   By: Jasmine Pang M.D.   On: 03/01/2017 20:41   Dg Hip Unilat W Or Wo Pelvis 2-3 Views Left  Result Date: 02/28/2017 CLINICAL DATA:  Acute left hip pain following fall today. Initial encounter. EXAM: DG HIP (WITH OR WITHOUT PELVIS) 2-3V LEFT COMPARISON:  None. FINDINGS: Bilateral total hip arthroplasties identified. No acute fracture or dislocation noted. No suspicious focal bony lesions are noted. Degenerative changes in the lower lumbar spine noted. IMPRESSION: No acute abnormality. Bilateral total hip arthroplasties. Electronically Signed   By: Harmon Pier M.D.   On: 02/28/2017 18:55   Dg Femur Min 2 Views Left  Result Date: 03/01/2017 CLINICAL DATA:  ORIF distal femur EXAM: LEFT FEMUR 2 VIEWS; DG C-ARM 61-120 MIN COMPARISON:  02/28/2017, 03/01/2017 FINDINGS: Two low resolution spot intraoperative views of the left femur. Total fluoroscopy time was 6 seconds. The  images demonstrate 2 screw fixation of distal femoral fracture. IMPRESSION: Intraoperative fluoroscopic assistance provided during surgical fixation of distal femoral fracture Electronically Signed   By: Jasmine Pang M.D.   On: 03/01/2017 20:41   Dg Femur Min 2 Views Left  Result Date: 02/28/2017 CLINICAL DATA:  Distal femur fracture. EXAM: LEFT FEMUR 2 VIEWS COMPARISON:  XR and the from earlier today. FINDINGS: The patient is status post left hip replacement. Hardware is in good  position. The fracture through the lateral aspect of the distal femur is again identified with no significant displacement. A joint effusion is noted. The remainder of the femur is normal. IMPRESSION: Known fracture in the lateral aspect of the distal femur. No other acute findings. Electronically Signed   By: Gerome Sam III M.D   On: 02/28/2017 20:05    Discharge Exam: Vitals:   03/03/17 2203 03/04/17 0505  BP: (!) 133/56 (!) 122/59  Pulse: 71 63  Resp: 17 17  Temp: 98.1 F (36.7 C) 97.7 F (36.5 C)   Vitals:   03/03/17 2203 03/04/17 0505 03/04/17 0826 03/04/17 0829  BP: (!) 133/56 (!) 122/59    Pulse: 71 63    Resp: 17 17    Temp: 98.1 F (36.7 C) 97.7 F (36.5 C)    TempSrc: Oral Oral    SpO2: 96% 99% 96% 95%  Weight:      Height:        General: Pt is alert, awake, not in acute distress Cardiovascular: RRR, S1/S2 +, no rubs, no gallops Respiratory: CTA bilaterally, no wheezing, no rhonchi Abdominal: Soft, NT, ND, bowel sounds + Extremities: no edema, no cyanosis    The results of significant diagnostics from this hospitalization (including imaging, microbiology, ancillary and laboratory) are listed below for reference.     Microbiology: Recent Results (from the past 240 hour(s))  Surgical pcr screen     Status: None   Collection Time: 03/01/17  2:28 PM  Result Value Ref Range Status   MRSA, PCR NEGATIVE NEGATIVE Final   Staphylococcus aureus NEGATIVE NEGATIVE Final    Comment:         The Xpert SA Assay (FDA approved for NASAL specimens in patients over 70 years of age), is one component of a comprehensive surveillance program.  Test performance has been validated by [facility] for patients greater than or equal to 63 year old. It is not intended to diagnose infection nor to guide or monitor treatment.      Labs: BNP (last 3 results) No results for input(s): BNP in the last 8760 hours. Basic Metabolic Panel:  Recent Labs Lab 02/28/17 2050 03/01/17 0131 03/02/17 0440 03/03/17 0652 03/04/17 0515  NA 138 136 137 134* 136  K 3.9 3.8 4.0 4.1 4.1  CL 107 106 109 104 108  CO2 22 23 24 24 25   GLUCOSE 106* 188* 141* 195* 151*  BUN 42* 38* 26* 24* 26*  CREATININE 2.18* 2.18* 1.78* 1.68* 1.59*  CALCIUM 7.9* 7.8* 7.1* 7.2* 7.3*   Liver Function Tests:  Recent Labs Lab 02/28/17 2050 03/03/17 0652  AST 24 75*  ALT 23 123*  ALKPHOS 63 112  BILITOT 0.5 0.6  PROT 6.8 5.7*  ALBUMIN 3.8 2.9*   No results for input(s): LIPASE, AMYLASE in the last 168 hours. No results for input(s): AMMONIA in the last 168 hours. CBC:  Recent Labs Lab 02/28/17 2050 03/01/17 0131 03/02/17 0440 03/03/17 0652 03/04/17 0515  WBC 8.8 8.8 6.7 5.3 6.7  NEUTROABS 5.3  --   --  3.8 4.3  HGB 10.4* 10.1* 8.8* 9.4* 8.6*  HCT 31.3* 31.7* 28.0* 30.5* 27.3*  MCV 90.7 91.4 93.3 92.7 91.9  PLT 252 216 167 172 172   Cardiac Enzymes: No results for input(s): CKTOTAL, CKMB, CKMBINDEX, TROPONINI in the last 168 hours. BNP: Invalid input(s): POCBNP CBG:  Recent Labs Lab 03/03/17 1155 03/03/17 1656 03/03/17 1812 03/03/17 2200 03/04/17 0802  GLUCAP 136* 316* 305* 167*  134*   D-Dimer No results for input(s): DDIMER in the last 72 hours. Hgb A1c No results for input(s): HGBA1C in the last 72 hours. Lipid Profile No results for input(s): CHOL, HDL, LDLCALC, TRIG, CHOLHDL, LDLDIRECT in the last 72 hours. Thyroid function studies No results for input(s): TSH, T4TOTAL,  T3FREE, THYROIDAB in the last 72 hours.  Invalid input(s): FREET3 Anemia work up No results for input(s): VITAMINB12, FOLATE, FERRITIN, TIBC, IRON, RETICCTPCT in the last 72 hours. Urinalysis No results found for: COLORURINE, APPEARANCEUR, LABSPEC, PHURINE, GLUCOSEU, HGBUR, BILIRUBINUR, KETONESUR, PROTEINUR, UROBILINOGEN, NITRITE, LEUKOCYTESUR Sepsis Labs Invalid input(s): PROCALCITONIN,  WBC,  LACTICIDVEN Microbiology Recent Results (from the past 240 hour(s))  Surgical pcr screen     Status: None   Collection Time: 03/01/17  2:28 PM  Result Value Ref Range Status   MRSA, PCR NEGATIVE NEGATIVE Final   Staphylococcus aureus NEGATIVE NEGATIVE Final    Comment:        The Xpert SA Assay (FDA approved for NASAL specimens in patients over 29 years of age), is one component of a comprehensive surveillance program.  Test performance has been validated by [facility] for patients greater than or equal to 51 year old. It is not intended to diagnose infection nor to guide or monitor treatment.      Time coordinating discharge:  35 minutes  SIGNED:  Latrelle Dodrill, MD  [facility] 03/04/2017, 11:40 AM  Pager please text page via  [email or web address] Password [email or web address]

## 2017-03-04 NOTE — Care Management Important Message (Signed)
Important Message  Patient Details  Name: Anita Butler MRN: [id number] Date of Birth: 01/06/1946   Medicare Important Message Given:  Yes    Anita Butler 03/04/2017, 3:15 PM

## 2017-03-04 NOTE — Progress Notes (Signed)
Subjective: 3 Days Post-Op Procedure(s) (LRB): OPEN REDUCTION INTERNAL FIXATION (ORIF) DISTAL FEMUR FRACTURE (Left)   Patient reports pain as mild.  She is eating, drinking, voiding. Up mobilizing with therapy well and compliant with touchdown weightbearing left lower extremity.  She is hoping to discharge soon-to [place] in [place] if possible. She lives alone with local support. One step into her house.  Objective: Vital signs in last 24 hours: Temp:  [97.7 F (36.5 C)-98.2 F (36.8 C)] 97.7 F (36.5 C) (07/02 0505) Pulse Rate:  [63-74] 63 (07/02 0505) Resp:  [17-19] 17 (07/02 0505) BP: (122-133)/(56-70) 122/59 (07/02 0505) SpO2:  [95 %-100 %] 99 % (07/02 0505)  Intake/Output from previous day: 07/01 0701 - 07/02 0700 In: 578 [P.O.:342; I.V.:236] Out: 4100 [Urine:4100] Intake/Output this shift: No intake/output data recorded.   Recent Labs  03/02/17 0440 03/03/17 0652 03/04/17 0515  HGB 8.8* 9.4* 8.6*    Recent Labs  03/03/17 0652 03/04/17 0515  WBC 5.3 6.7  RBC 3.29* 2.97*  HCT 30.5* 27.3*  PLT 172 172    Recent Labs  03/03/17 0652 03/04/17 0515  NA 134* 136  K 4.1 4.1  CL 104 108  CO2 24 25  BUN 24* 26*  CREATININE 1.68* 1.59*  GLUCOSE 195* 151*  CALCIUM 7.2* 7.3*   No results for input(s): LABPT, INR in the last 72 hours. Left lower extremity: Neurovascular intact Sensation intact distally Intact pulses distally Dorsiflexion/Plantar flexion intact Incision: dressing mild dry bloody drainage. Compartment soft  Assessment/Plan: 3 Days Post-Op Procedure(s) (LRB): OPEN REDUCTION INTERNAL FIXATION (ORIF) DISTAL FEMUR FRACTURE (Left)   Up with therapy  TDWB LLE.  Maintain knee immobilizer until follow-up. Gait training PT D/c planning per primary team Dsg change today: [employer], gauze, ABDs, Kerlix. Subsequent changes when necessary.  Follow up in the office with Dr. Wandra Feinstein. Murphy in 2 weeks.  Please call with questions.   Anita KernHenry Calvin Butler  III 03/04/2017, 0900

## 2017-03-04 NOTE — Progress Notes (Signed)
OT  Note  Patient Details Name: Ida RogueJudy Kuan MRN: [id number] DOB: Jun 19, 1946   Cancelled Treatment:    Reason Eval/Treat Not Completed: Other (comment) (defer evaluation to SNF level )  Harolyn RutherfordJones, Laquitta Dominski B  [phone] 03/04/2017, 2:49 PM

## 2017-03-05 LAB — GLUCOSE, CAPILLARY
GLUCOSE-CAPILLARY: 280 mg/dL — AB (ref 65–99)
Glucose-Capillary: 100 mg/dL — ABNORMAL HIGH (ref 65–99)
Glucose-Capillary: 236 mg/dL — ABNORMAL HIGH (ref 65–99)

## 2017-03-05 NOTE — Progress Notes (Signed)
[facility]   Patient discharged yesterday, [facility] Patient seen chart reviewed. BP slight elevated this morning but improved with medications. No acute events overnight. No new issues or problems reported. No changes in current plan and management. Patient will be discharge to SNF for STR.   Latrelle DodrillEdwin Silva, MD

## 2017-03-05 NOTE — Clinical Social Work Placement (Signed)
   CLINICAL SOCIAL WORK PLACEMENT  NOTE  Date:  03/05/2017  Patient Details  Name: Anita Butler MRN: [id number] Date of Birth: 1946-01-02  Clinical Social Work is seeking post-discharge placement for this patient at the Skilled  Nursing Facility level of care (*CSW will initial, date and re-position this form in  chart as items are completed):  Yes   Patient/family provided with [facility] Clinical Social Work Department's list of facilities offering this level of care within the geographic area requested by the patient (or if unable, by the patient's family).  Yes   Patient/family informed of their freedom to choose among providers that offer the needed level of care, that participate in Medicare, Medicaid or managed care program needed by the patient, have an available bed and are willing to accept the patient.  Yes   Patient/family informed of [facility]'s ownership interest in [facility] and [facility], as well as of the fact that they are under no obligation to receive care at these facilities.  PASRR submitted to EDS on 03/04/17     PASRR number received on 03/05/17     Existing PASRR number confirmed on       FL2 transmitted to all facilities in geographic area requested by pt/family on 03/04/17     FL2 transmitted to all facilities within larger geographic area on       Patient informed that his/her managed care company has contracts with or will negotiate with certain facilities, including the following:        Yes   Patient/family informed of bed offers received.  [facility]     Physician recommends and patient chooses bed at      [facility]  Patient to be transferred to facility by [facility]     Patient family notified on 03/05/17 of transfer.  Name of family member notified:  [name]     PHYSICIAN Please prepare prescriptions     Additional Comment:     _______________________________________________ Dominic PeaJeneya G Tarrell Debes, LCSW 03/05/2017, 12:46 PM

## 2017-03-05 NOTE — Clinical Social Work Note (Addendum)
Clinical Social Work Assessment  Patient Details  Name: Anita Butler MRN: [id number] Date of Birth: 1946-06-16  Date of referral:  03/05/17               Reason for consult:  Discharge Planning                Permission sought to share information with:  Family Supports Permission granted to share information::  Yes, Verbal Permission Granted  Name::     Anita Butler; Anita Butler  Agency::  [facility]  Relationship::  Daughters  Contact Information:  [phone]; [phone]  Housing/Transportation Living arrangements for the past 2 months:  Apartment (In [place]) Source of Information:  Patient Patient Interpreter Needed:  None Criminal Activity/Legal Involvement Pertinent to Current Situation/Hospitalization:  No - Comment as needed Significant Relationships:  Adult Children, Other Family Members, Neighbor, Friend Lives with:  Self Do you feel safe going back to the place where you live?  Yes Need for family participation in patient care:  Yes (Comment) ([place] of [place])  Care giving concerns:  No care giving concerns identified.    Social Worker assessment / plan:  CSW met with pt at bedside yesterday to address consult for [facility]. Pt from [place] and lvies alone. Pt was visiting daughter, Anita Butler, in [place]. P/T recommending SNF for STR. Pt agreeable to SNF and chose [facility]. CSW confirmed bed offer with Anita Butler (admissions) at [facility]. CSW sent clinicals through hub. CSW met with pt and family at length discussing discharge process and post discharge care. Pt was ready for discharge yesterday, but was a manual review for PASRR. CSW updated MD, RN, pt, family, and facility.   CSW received PASRR #:[id number] A today. CSW provided PASRR# to Anita Butler at [facility]. CSW waiting on pt's room to be ready at [facility], per Anita Butler. Family and pt updated.  4:36pm- CSW received approval from [place] for pt to discharge to [facility]. CSW spoke with  Anita Butler at 4:38pm to arrange ambulance transportation with [facility]. CSW added room and report to treatment team sticky note and provided to [employer]. CSW called [name] [phone] to notify that pt is discharging. CSW signing off as no further Social Work needs identified.    Employment status:  Retired Forensic scientist:  Medicare PT Recommendations:  [place] / Referral to community resources:  [place]  Patient/Family's Response to care:  Pt and daughters appreciative of CSW support and guidance.   Patient/Family's Understanding of and Emotional Response to Diagnosis, Current Treatment, and Prognosis:  Pt and daughters express understanding of pt's medical state.   Emotional Assessment Appearance:  Appears stated age Attitude/Demeanor/Rapport:  Other (Appropriate) Affect (typically observed):  Accepting, Adaptable, Pleasant Orientation:  Oriented to Self, Oriented to Place, Oriented to  Time, Oriented to Situation Alcohol / Substance use:  Tobacco Use Psych involvement (Current and /or in the community):  No (Comment)  Discharge Needs  Concerns to be addressed:  Discharge Planning Concerns, Care Coordination Readmission within the last 30 days:  No Current discharge risk:  Dependent with Mobility, Lives alone Barriers to Discharge:  Continued Medical Work up, Programmer, applications ([place])   Truitt Merle, LCSW 03/05/2017, 12:51 PM

## 2017-03-05 NOTE — Progress Notes (Signed)
[facility], attempted to call a couple of times for report but nobody answers.

## 2018-03-02 IMAGING — DX DG HIP (WITH OR WITHOUT PELVIS) 2-3V*L*
3 series · 3 of 3 positions shown · non-contrast
Comparison: None.

CLINICAL DATA: Acute left hip pain following fall today. Initial
encounter.

EXAM:
DG HIP (WITH OR WITHOUT PELVIS) 2-3V LEFT

[pelvis ap]
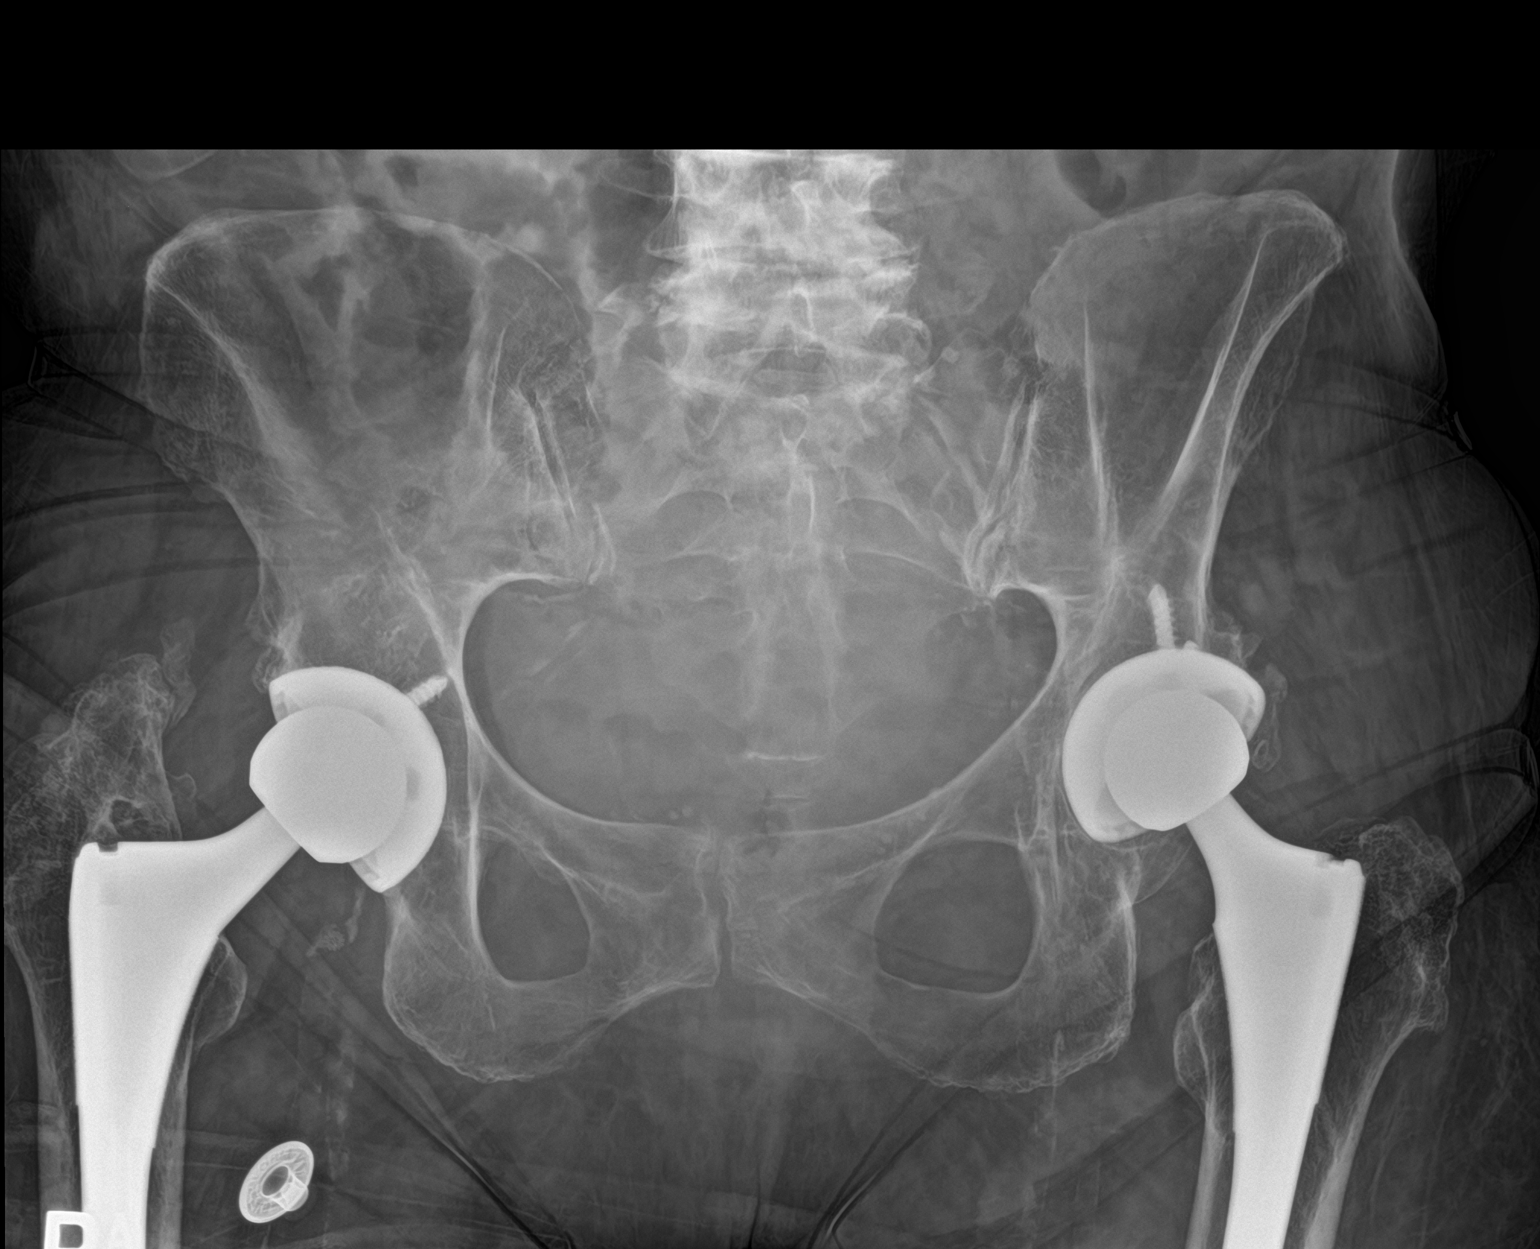

[hip ap]
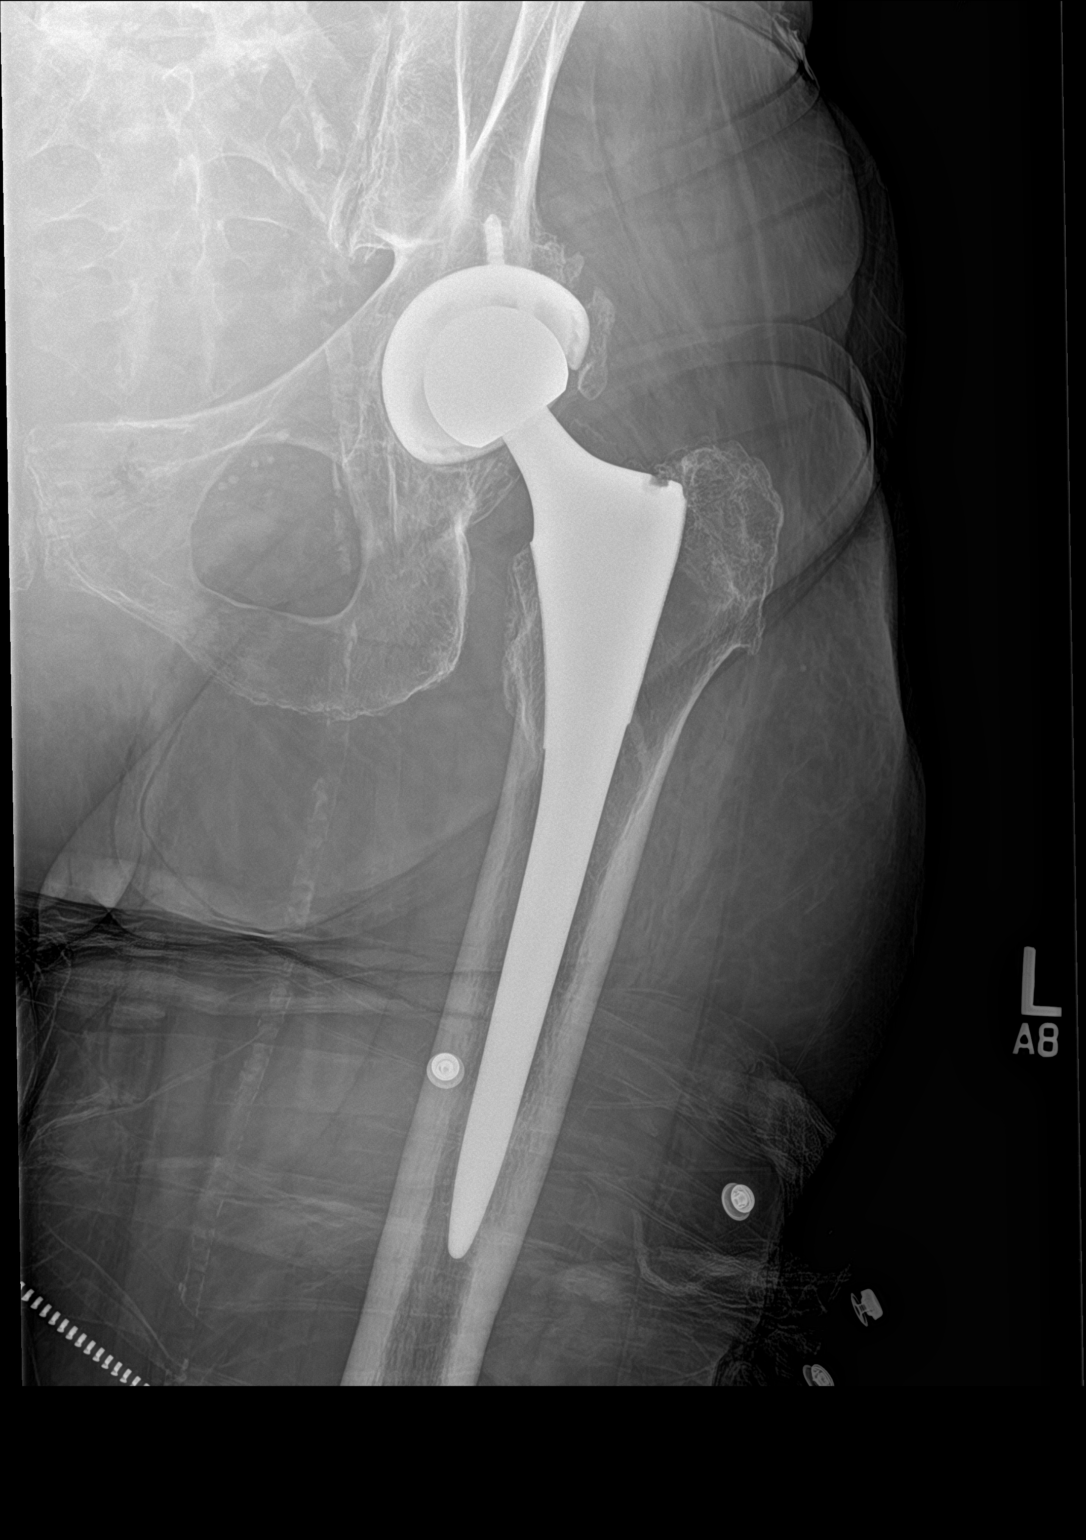

[hip lat]
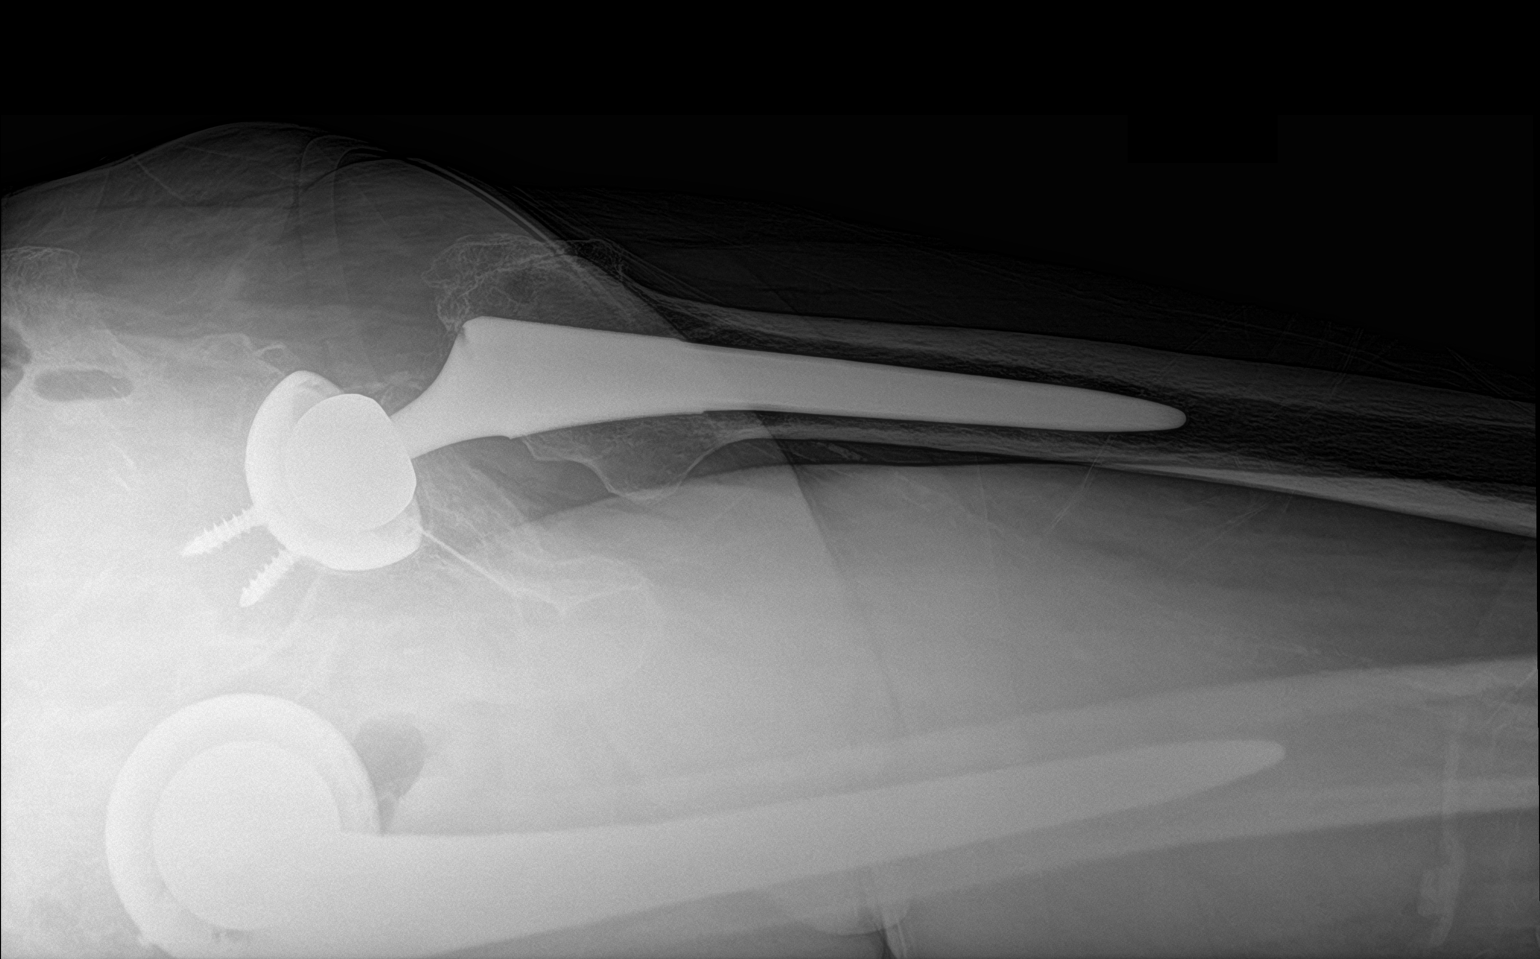

[3 of 3 positions shown; findings below may reference images not displayed]

FINDINGS: Bilateral total hip arthroplasties identified.

No acute fracture or dislocation noted.

No suspicious focal bony lesions are noted.

Degenerative changes in the lower lumbar spine noted.
IMPRESSION: No acute abnormality.

Bilateral total hip arthroplasties.

## 2018-03-04 IMAGING — DX DG ANKLE 2V *R*
2 series · 2 of 2 positions shown · non-contrast
Comparison: None.

CLINICAL DATA: 70-year-old female with right ankle pain since
falling while getting out of the swimming pool this past [REDACTED]

EXAM:
RIGHT ANKLE - 2 VIEW

[ankle ap]
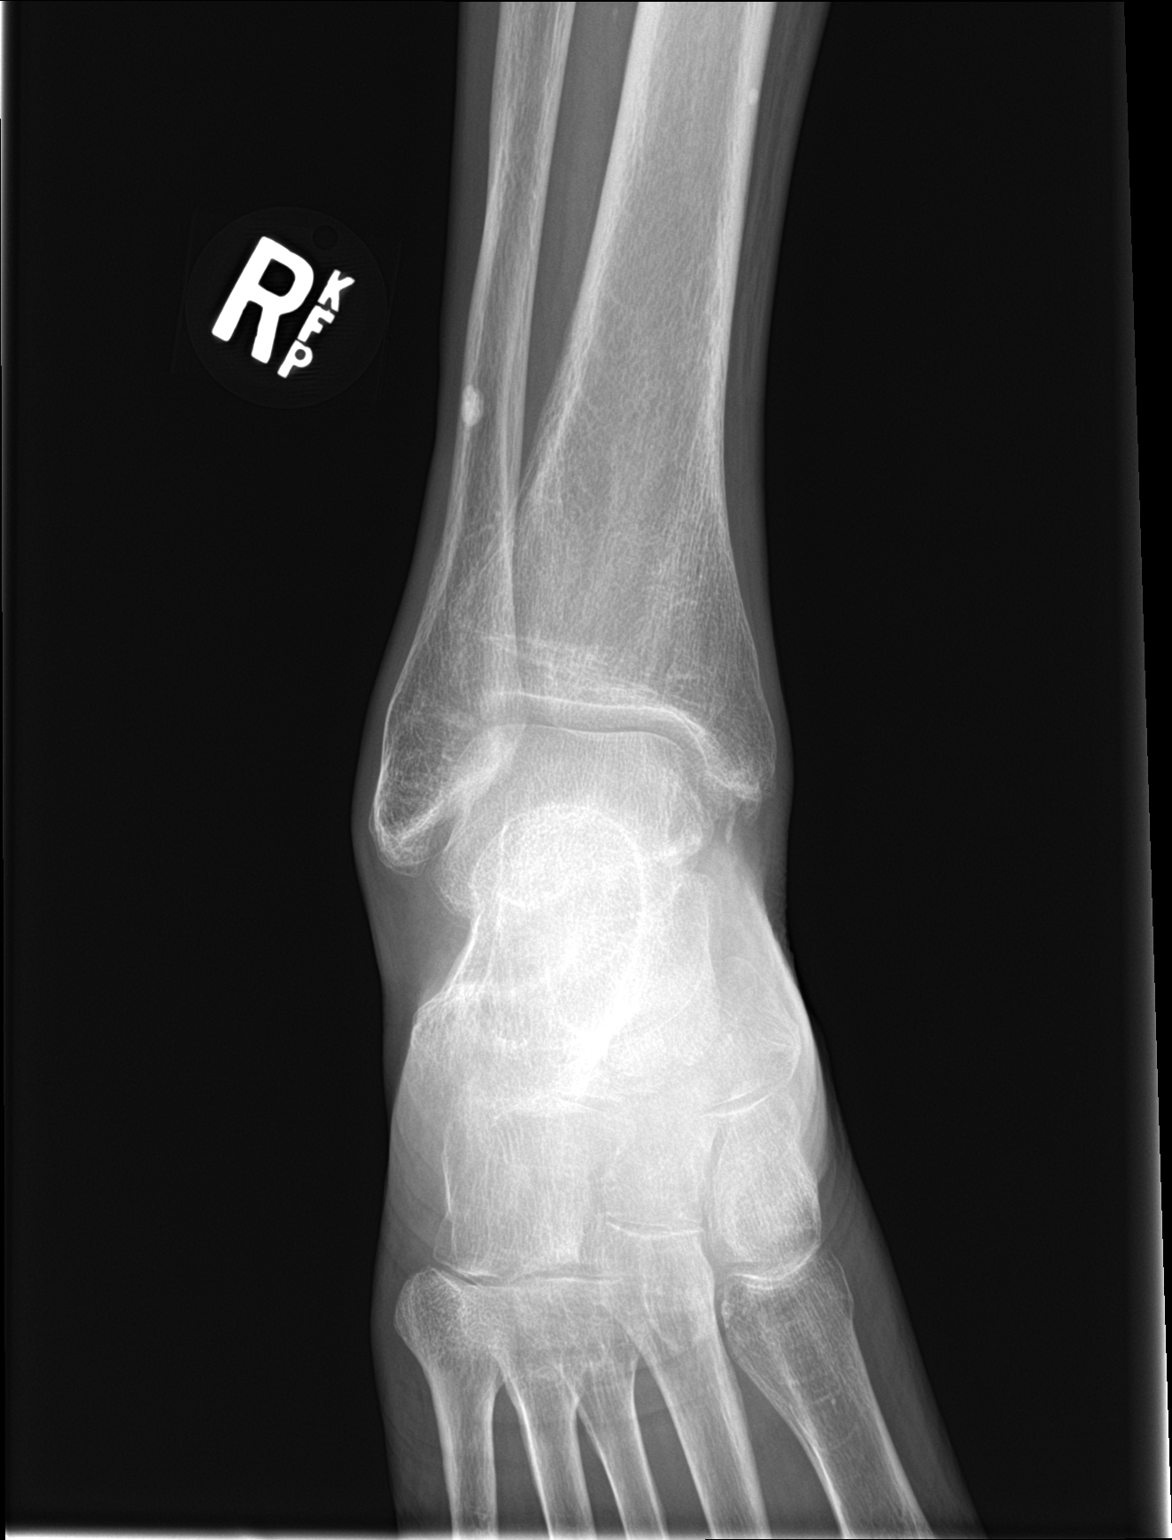

[ankle lat]
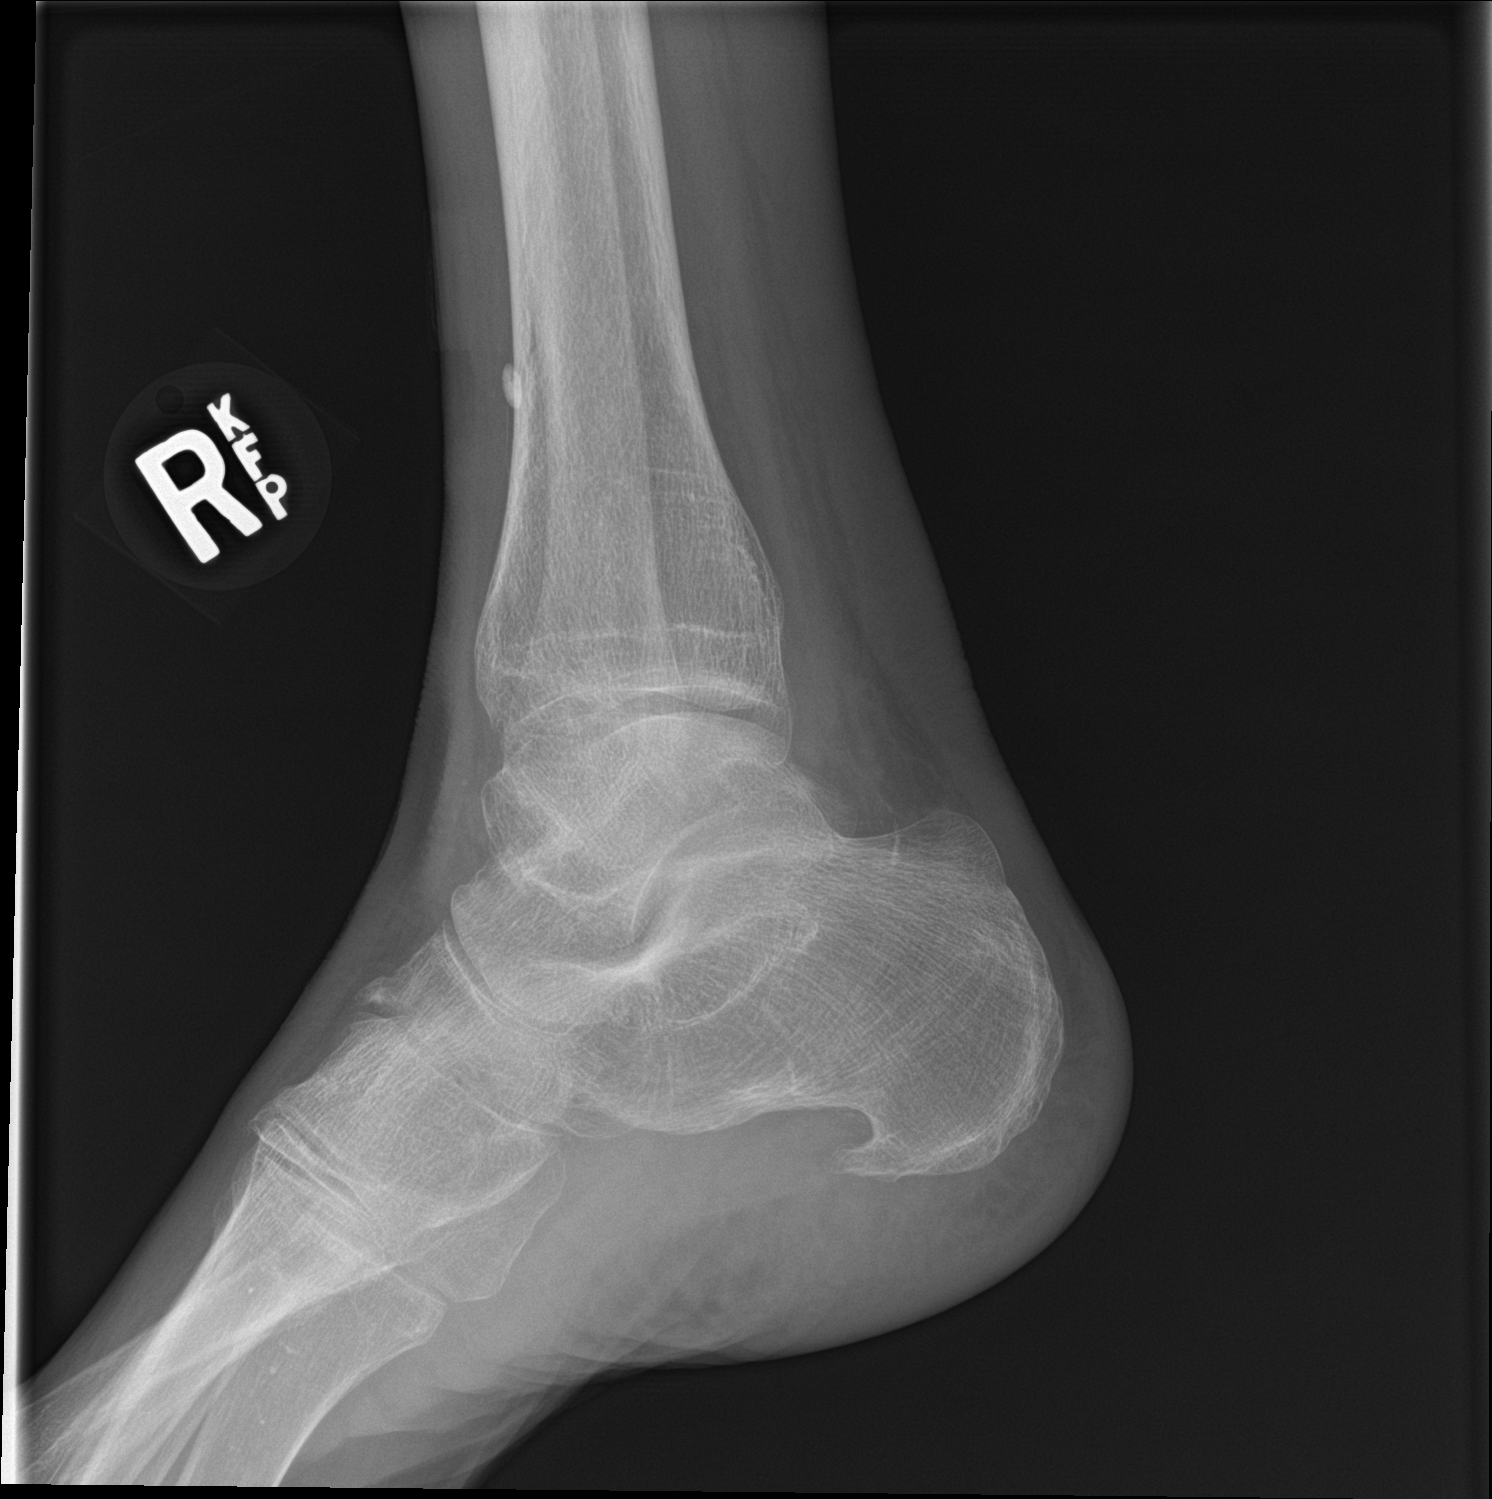

[2 of 2 positions shown; findings below may reference images not displayed]

FINDINGS: There is no evidence of fracture, dislocation, or joint effusion.
The soft tissue calcification overlies the distal fibular diaphysis.
Mild degenerative bone spurring in the midfoot at the navicular
cuneiform joint. Moderate-sized plantar calcaneal spur. Soft tissues
are unremarkable.
IMPRESSION: 1. No evidence of acute fracture or malalignment.
2. Midfoot degenerative osteoarthritis.
3. Plantar calcaneal spur.

## 2019-10-05 DEATH — deceased
